# Patient Record
Sex: Female | Born: 1950 | Hispanic: Yes | Smoking: Never smoker
Health system: Southern US, Community
[De-identification: ages and names within clinical notes are randomized; demographics above are authoritative.]

## PROBLEM LIST (undated history)

## (undated) DIAGNOSIS — I10 Essential (primary) hypertension: Secondary | ICD-10-CM

---

## 2016-12-31 DIAGNOSIS — Z136 Encounter for screening for cardiovascular disorders: Secondary | ICD-10-CM | POA: Diagnosis not present

## 2016-12-31 DIAGNOSIS — H538 Other visual disturbances: Secondary | ICD-10-CM | POA: Diagnosis not present

## 2016-12-31 DIAGNOSIS — Z1383 Encounter for screening for respiratory disorder NEC: Secondary | ICD-10-CM | POA: Diagnosis not present

## 2016-12-31 DIAGNOSIS — Z5181 Encounter for therapeutic drug level monitoring: Secondary | ICD-10-CM | POA: Diagnosis not present

## 2016-12-31 DIAGNOSIS — Z131 Encounter for screening for diabetes mellitus: Secondary | ICD-10-CM | POA: Diagnosis not present

## 2016-12-31 DIAGNOSIS — Z950 Presence of cardiac pacemaker: Secondary | ICD-10-CM | POA: Diagnosis not present

## 2016-12-31 DIAGNOSIS — Z113 Encounter for screening for infections with a predominantly sexual mode of transmission: Secondary | ICD-10-CM | POA: Diagnosis not present

## 2016-12-31 DIAGNOSIS — Z01118 Encounter for examination of ears and hearing with other abnormal findings: Secondary | ICD-10-CM | POA: Diagnosis not present

## 2016-12-31 DIAGNOSIS — I10 Essential (primary) hypertension: Secondary | ICD-10-CM | POA: Diagnosis not present

## 2016-12-31 DIAGNOSIS — R251 Tremor, unspecified: Secondary | ICD-10-CM | POA: Diagnosis not present

## 2016-12-31 DIAGNOSIS — Z0000 Encounter for general adult medical examination without abnormal findings: Secondary | ICD-10-CM | POA: Diagnosis not present

## 2017-01-02 ENCOUNTER — Inpatient Hospital Stay (HOSPITAL_COMMUNITY)
Admission: EM | Admit: 2017-01-02 | Discharge: 2017-01-27 | DRG: 020 | Disposition: E | Payer: Medicare HMO | Attending: Pulmonary Disease | Admitting: Pulmonary Disease

## 2017-01-02 ENCOUNTER — Emergency Department (HOSPITAL_COMMUNITY): Payer: Medicare HMO

## 2017-01-02 ENCOUNTER — Encounter (HOSPITAL_COMMUNITY): Payer: Self-pay

## 2017-01-02 DIAGNOSIS — K567 Ileus, unspecified: Secondary | ICD-10-CM | POA: Diagnosis not present

## 2017-01-02 DIAGNOSIS — J9601 Acute respiratory failure with hypoxia: Secondary | ICD-10-CM | POA: Diagnosis not present

## 2017-01-02 DIAGNOSIS — I517 Cardiomegaly: Secondary | ICD-10-CM | POA: Diagnosis not present

## 2017-01-02 DIAGNOSIS — R0902 Hypoxemia: Secondary | ICD-10-CM | POA: Diagnosis not present

## 2017-01-02 DIAGNOSIS — E878 Other disorders of electrolyte and fluid balance, not elsewhere classified: Secondary | ICD-10-CM | POA: Diagnosis not present

## 2017-01-02 DIAGNOSIS — R40243 Glasgow coma scale score 3-8, unspecified time: Secondary | ICD-10-CM | POA: Diagnosis present

## 2017-01-02 DIAGNOSIS — I10 Essential (primary) hypertension: Secondary | ICD-10-CM | POA: Diagnosis present

## 2017-01-02 DIAGNOSIS — E876 Hypokalemia: Secondary | ICD-10-CM | POA: Diagnosis present

## 2017-01-02 DIAGNOSIS — E872 Acidosis: Secondary | ICD-10-CM | POA: Diagnosis present

## 2017-01-02 DIAGNOSIS — Y95 Nosocomial condition: Secondary | ICD-10-CM | POA: Diagnosis not present

## 2017-01-02 DIAGNOSIS — I609 Nontraumatic subarachnoid hemorrhage, unspecified: Secondary | ICD-10-CM

## 2017-01-02 DIAGNOSIS — R402432 Glasgow coma scale score 3-8, at arrival to emergency department: Secondary | ICD-10-CM | POA: Diagnosis not present

## 2017-01-02 DIAGNOSIS — Z515 Encounter for palliative care: Secondary | ICD-10-CM | POA: Diagnosis not present

## 2017-01-02 DIAGNOSIS — I602 Nontraumatic subarachnoid hemorrhage from anterior communicating artery: Principal | ICD-10-CM | POA: Diagnosis present

## 2017-01-02 DIAGNOSIS — E870 Hyperosmolality and hypernatremia: Secondary | ICD-10-CM | POA: Diagnosis not present

## 2017-01-02 DIAGNOSIS — I608 Other nontraumatic subarachnoid hemorrhage: Secondary | ICD-10-CM | POA: Diagnosis present

## 2017-01-02 DIAGNOSIS — I161 Hypertensive emergency: Secondary | ICD-10-CM | POA: Diagnosis present

## 2017-01-02 DIAGNOSIS — D72820 Lymphocytosis (symptomatic): Secondary | ICD-10-CM | POA: Diagnosis not present

## 2017-01-02 DIAGNOSIS — I729 Aneurysm of unspecified site: Secondary | ICD-10-CM

## 2017-01-02 DIAGNOSIS — Z23 Encounter for immunization: Secondary | ICD-10-CM | POA: Diagnosis not present

## 2017-01-02 DIAGNOSIS — R140 Abdominal distension (gaseous): Secondary | ICD-10-CM

## 2017-01-02 DIAGNOSIS — I6200 Nontraumatic subdural hemorrhage, unspecified: Secondary | ICD-10-CM | POA: Diagnosis not present

## 2017-01-02 DIAGNOSIS — R402431 Glasgow coma scale score 3-8, in the field [EMT or ambulance]: Secondary | ICD-10-CM | POA: Diagnosis not present

## 2017-01-02 DIAGNOSIS — Z7189 Other specified counseling: Secondary | ICD-10-CM | POA: Diagnosis not present

## 2017-01-02 DIAGNOSIS — Z01818 Encounter for other preprocedural examination: Secondary | ICD-10-CM

## 2017-01-02 DIAGNOSIS — J9690 Respiratory failure, unspecified, unspecified whether with hypoxia or hypercapnia: Secondary | ICD-10-CM

## 2017-01-02 DIAGNOSIS — J9600 Acute respiratory failure, unspecified whether with hypoxia or hypercapnia: Secondary | ICD-10-CM | POA: Diagnosis not present

## 2017-01-02 DIAGNOSIS — Z6836 Body mass index (BMI) 36.0-36.9, adult: Secondary | ICD-10-CM

## 2017-01-02 DIAGNOSIS — G9340 Encephalopathy, unspecified: Secondary | ICD-10-CM | POA: Diagnosis present

## 2017-01-02 DIAGNOSIS — Z66 Do not resuscitate: Secondary | ICD-10-CM | POA: Diagnosis not present

## 2017-01-02 DIAGNOSIS — Z950 Presence of cardiac pacemaker: Secondary | ICD-10-CM

## 2017-01-02 DIAGNOSIS — E663 Overweight: Secondary | ICD-10-CM | POA: Diagnosis present

## 2017-01-02 DIAGNOSIS — S066X0A Traumatic subarachnoid hemorrhage without loss of consciousness, initial encounter: Secondary | ICD-10-CM | POA: Diagnosis not present

## 2017-01-02 DIAGNOSIS — R739 Hyperglycemia, unspecified: Secondary | ICD-10-CM | POA: Diagnosis present

## 2017-01-02 DIAGNOSIS — I671 Cerebral aneurysm, nonruptured: Secondary | ICD-10-CM | POA: Diagnosis not present

## 2017-01-02 DIAGNOSIS — Z8249 Family history of ischemic heart disease and other diseases of the circulatory system: Secondary | ICD-10-CM

## 2017-01-02 DIAGNOSIS — E8770 Fluid overload, unspecified: Secondary | ICD-10-CM | POA: Diagnosis not present

## 2017-01-02 DIAGNOSIS — R569 Unspecified convulsions: Secondary | ICD-10-CM | POA: Diagnosis not present

## 2017-01-02 DIAGNOSIS — J690 Pneumonitis due to inhalation of food and vomit: Secondary | ICD-10-CM | POA: Diagnosis not present

## 2017-01-02 DIAGNOSIS — J189 Pneumonia, unspecified organism: Secondary | ICD-10-CM | POA: Diagnosis not present

## 2017-01-02 DIAGNOSIS — D649 Anemia, unspecified: Secondary | ICD-10-CM | POA: Diagnosis present

## 2017-01-02 DIAGNOSIS — R9401 Abnormal electroencephalogram [EEG]: Secondary | ICD-10-CM | POA: Diagnosis present

## 2017-01-02 DIAGNOSIS — Z9289 Personal history of other medical treatment: Secondary | ICD-10-CM

## 2017-01-02 DIAGNOSIS — R293 Abnormal posture: Secondary | ICD-10-CM | POA: Diagnosis not present

## 2017-01-02 DIAGNOSIS — J9811 Atelectasis: Secondary | ICD-10-CM | POA: Diagnosis not present

## 2017-01-02 HISTORY — DX: Essential (primary) hypertension: I10

## 2017-01-02 LAB — DIFFERENTIAL
BAND NEUTROPHILS: 0 %
BASOS ABS: 0 10*3/uL (ref 0.0–0.1)
BLASTS: 0 %
Basophils Relative: 0 %
EOS PCT: 5 %
Eosinophils Absolute: 0.6 10*3/uL (ref 0.0–0.7)
LYMPHS ABS: 6.1 10*3/uL — AB (ref 0.7–4.0)
Lymphocytes Relative: 47 %
METAMYELOCYTES PCT: 0 %
MONOS PCT: 7 %
MYELOCYTES: 0 %
Monocytes Absolute: 0.9 10*3/uL (ref 0.1–1.0)
NEUTROS ABS: 5.3 10*3/uL (ref 1.7–7.7)
Neutrophils Relative %: 41 %
OTHER: 0 %
Promyelocytes Absolute: 0 %
nRBC: 0 /100 WBC

## 2017-01-02 LAB — COMPREHENSIVE METABOLIC PANEL
ALBUMIN: 3.7 g/dL (ref 3.5–5.0)
ALK PHOS: 107 U/L (ref 38–126)
ALT: 64 U/L — ABNORMAL HIGH (ref 14–54)
ANION GAP: 7 (ref 5–15)
AST: 67 U/L — ABNORMAL HIGH (ref 15–41)
BILIRUBIN TOTAL: 0.8 mg/dL (ref 0.3–1.2)
BUN: 20 mg/dL (ref 6–20)
CO2: 22 mmol/L (ref 22–32)
Calcium: 8.1 mg/dL — ABNORMAL LOW (ref 8.9–10.3)
Chloride: 110 mmol/L (ref 101–111)
Creatinine, Ser: 0.83 mg/dL (ref 0.44–1.00)
GFR calc non Af Amer: >60 mL/min (ref >60)
GLUCOSE: 165 mg/dL — AB (ref 65–99)
POTASSIUM: 3.3 mmol/L — AB (ref 3.5–5.1)
Sodium: 139 mmol/L (ref 135–145)
TOTAL PROTEIN: 5.7 g/dL — AB (ref 6.5–8.1)

## 2017-01-02 LAB — I-STAT ARTERIAL BLOOD GAS, ED
ACID-BASE EXCESS: 2 mmol/L (ref 0.0–2.0)
BICARBONATE: 25.4 mmol/L (ref 20.0–28.0)
O2 Saturation: 100 %
TCO2: 26 mmol/L (ref 0–100)
pCO2 arterial: 34.7 mmHg (ref 32.0–48.0)
pH, Arterial: 7.471 — ABNORMAL HIGH (ref 7.350–7.450)
pO2, Arterial: 357 mmHg — ABNORMAL HIGH (ref 83.0–108.0)

## 2017-01-02 LAB — I-STAT CHEM 8, ED
BUN: 23 mg/dL — AB (ref 6–20)
CREATININE: 0.8 mg/dL (ref 0.44–1.00)
Calcium, Ion: 1.06 mmol/L — ABNORMAL LOW (ref 1.15–1.40)
Chloride: 108 mmol/L (ref 101–111)
GLUCOSE: 162 mg/dL — AB (ref 65–99)
HCT: 33 % — ABNORMAL LOW (ref 36.0–46.0)
HEMOGLOBIN: 11.2 g/dL — AB (ref 12.0–15.0)
Potassium: 3.4 mmol/L — ABNORMAL LOW (ref 3.5–5.1)
Sodium: 143 mmol/L (ref 135–145)
TCO2: 23 mmol/L (ref 0–100)

## 2017-01-02 LAB — URINALYSIS, ROUTINE W REFLEX MICROSCOPIC
BACTERIA UA: NONE SEEN
BILIRUBIN URINE: NEGATIVE
Glucose, UA: NEGATIVE mg/dL
Hgb urine dipstick: NEGATIVE
KETONES UR: NEGATIVE mg/dL
LEUKOCYTES UA: NEGATIVE
NITRITE: NEGATIVE
PH: 6 (ref 5.0–8.0)
PROTEIN: 100 mg/dL — AB
Specific Gravity, Urine: 1.017 (ref 1.005–1.030)

## 2017-01-02 LAB — I-STAT TROPONIN, ED: TROPONIN I, POC: 0 ng/mL (ref 0.00–0.08)

## 2017-01-02 LAB — RAPID URINE DRUG SCREEN, HOSP PERFORMED
Amphetamines: NOT DETECTED
Barbiturates: NOT DETECTED
Benzodiazepines: NOT DETECTED
Cocaine: NOT DETECTED
OPIATES: NOT DETECTED
TETRAHYDROCANNABINOL: NOT DETECTED

## 2017-01-02 LAB — APTT: aPTT: 34 seconds (ref 24–36)

## 2017-01-02 LAB — CBC
HEMATOCRIT: 36 % (ref 36.0–46.0)
HEMOGLOBIN: 11.9 g/dL — AB (ref 12.0–15.0)
MCH: 28.4 pg (ref 26.0–34.0)
MCHC: 33.1 g/dL (ref 30.0–36.0)
MCV: 85.9 fL (ref 78.0–100.0)
Platelets: 236 10*3/uL (ref 150–400)
RBC: 4.19 MIL/uL (ref 3.87–5.11)
RDW: 12.3 % (ref 11.5–15.5)
WBC: 12.9 10*3/uL — ABNORMAL HIGH (ref 4.0–10.5)

## 2017-01-02 LAB — PROTIME-INR
INR: 0.97
Prothrombin Time: 12.8 seconds (ref 11.4–15.2)

## 2017-01-02 LAB — TRIGLYCERIDES: Triglycerides: 279 mg/dL — ABNORMAL HIGH (ref <150)

## 2017-01-02 LAB — ETHANOL: Alcohol, Ethyl (B): <5 mg/dL (ref <5)

## 2017-01-02 MED ORDER — PROPOFOL 1000 MG/100ML IV EMUL
INTRAVENOUS | Status: AC
  Administered 2017-01-02: 5 ug/kg/min
  Filled 2017-01-02: qty 100

## 2017-01-02 MED ORDER — NALOXONE HCL 2 MG/2ML IJ SOSY
PREFILLED_SYRINGE | INTRAMUSCULAR | Status: AC
  Filled 2017-01-02: qty 2

## 2017-01-02 MED ORDER — HYDRALAZINE HCL 20 MG/ML IJ SOLN
20.0000 mg | Freq: Once | INTRAMUSCULAR | Status: AC
  Administered 2017-01-02: 10 mg via INTRAVENOUS
  Filled 2017-01-02: qty 1

## 2017-01-02 MED ORDER — SODIUM CHLORIDE 0.9 % IV SOLN
1000.0000 mg | Freq: Once | INTRAVENOUS | Status: AC
  Administered 2017-01-02: 1000 mg via INTRAVENOUS
  Filled 2017-01-02: qty 10

## 2017-01-02 MED ORDER — NALOXONE HCL 2 MG/2ML IJ SOSY
PREFILLED_SYRINGE | INTRAMUSCULAR | Status: AC | PRN
  Administered 2017-01-02: 2 mg via INTRAVENOUS

## 2017-01-02 MED ORDER — CHLORHEXIDINE GLUCONATE 0.12% ORAL RINSE (MEDLINE KIT)
15.0000 mL | Freq: Two times a day (BID) | OROMUCOSAL | Status: DC
  Administered 2017-01-02 – 2017-01-13 (×22): 15 mL via OROMUCOSAL

## 2017-01-02 MED ORDER — SUCCINYLCHOLINE CHLORIDE 20 MG/ML IJ SOLN
INTRAMUSCULAR | Status: DC | PRN
  Administered 2017-01-02: 100 mg via INTRAVENOUS

## 2017-01-02 MED ORDER — SODIUM CHLORIDE 0.9 % IV SOLN
1000.0000 mg | Freq: Two times a day (BID) | INTRAVENOUS | Status: DC
  Administered 2017-01-03 – 2017-01-13 (×21): 1000 mg via INTRAVENOUS
  Filled 2017-01-02 (×22): qty 10

## 2017-01-02 MED ORDER — LABETALOL HCL 5 MG/ML IV SOLN: 20.0000 mg | Freq: Once | INTRAVENOUS | Status: DC

## 2017-01-02 MED ORDER — POTASSIUM CHLORIDE 10 MEQ/100ML IV SOLN
10.0000 meq | INTRAVENOUS | Status: AC
  Administered 2017-01-02 – 2017-01-03 (×2): 10 meq via INTRAVENOUS
  Filled 2017-01-02 (×2): qty 100

## 2017-01-02 MED ORDER — ORAL CARE MOUTH RINSE
15.0000 mL | OROMUCOSAL | Status: DC
  Administered 2017-01-03 – 2017-01-13 (×103): 15 mL via OROMUCOSAL

## 2017-01-02 MED ORDER — ONDANSETRON HCL 4 MG/2ML IJ SOLN
4.0000 mg | Freq: Four times a day (QID) | INTRAMUSCULAR | Status: DC | PRN
  Filled 2017-01-02: qty 2

## 2017-01-02 MED ORDER — ETOMIDATE 2 MG/ML IV SOLN
INTRAVENOUS | Status: DC | PRN
  Administered 2017-01-02: 20 mg via INTRAVENOUS

## 2017-01-02 MED ORDER — FENTANYL CITRATE (PF) 100 MCG/2ML IJ SOLN
50.0000 ug | INTRAMUSCULAR | Status: DC | PRN
  Administered 2017-01-05: 50 ug via INTRAVENOUS
  Filled 2017-01-02 (×2): qty 2

## 2017-01-02 MED ORDER — NICARDIPINE HCL IN NACL 20-0.86 MG/200ML-% IV SOLN
3.0000 mg/h | INTRAVENOUS | Status: DC
  Administered 2017-01-02 (×2): 7.5 mg/h via INTRAVENOUS
  Administered 2017-01-03: 3 mg/h via INTRAVENOUS
  Administered 2017-01-03: 5 mg/h via INTRAVENOUS
  Administered 2017-01-03: 2.5 mg/h via INTRAVENOUS
  Administered 2017-01-03: 3.5 mg/h via INTRAVENOUS
  Administered 2017-01-04: 5 mg/h via INTRAVENOUS
  Administered 2017-01-04: 7.5 mg/h via INTRAVENOUS
  Administered 2017-01-04: 8 mg/h via INTRAVENOUS
  Administered 2017-01-04: 5 mg/h via INTRAVENOUS
  Administered 2017-01-04: 7 mg/h via INTRAVENOUS
  Administered 2017-01-04 – 2017-01-05 (×3): 5 mg/h via INTRAVENOUS
  Administered 2017-01-05: 7 mg/h via INTRAVENOUS
  Administered 2017-01-05: 5 mg/h via INTRAVENOUS
  Administered 2017-01-05: 7 mg/h via INTRAVENOUS
  Administered 2017-01-05: 8 mg/h via INTRAVENOUS
  Administered 2017-01-06: 5 mg/h via INTRAVENOUS
  Administered 2017-01-06: 7 mg/h via INTRAVENOUS
  Administered 2017-01-06: 3 mg/h via INTRAVENOUS
  Administered 2017-01-06 (×2): 11 mg/h via INTRAVENOUS
  Administered 2017-01-06: 4 mg/h via INTRAVENOUS
  Administered 2017-01-06: 10 mg/h via INTRAVENOUS
  Administered 2017-01-07 (×2): 15 mg/h via INTRAVENOUS
  Administered 2017-01-07: 7.5 mg/h via INTRAVENOUS
  Administered 2017-01-07: 10 mg/h via INTRAVENOUS
  Administered 2017-01-07: 15 mg/h via INTRAVENOUS
  Administered 2017-01-07 (×2): 12 mg/h via INTRAVENOUS
  Administered 2017-01-07: 12.5 mg/h via INTRAVENOUS
  Administered 2017-01-07: 8 mg/h via INTRAVENOUS
  Filled 2017-01-02 (×33): qty 200

## 2017-01-02 MED ORDER — PROPOFOL 1000 MG/100ML IV EMUL
0.0000 ug/kg/min | INTRAVENOUS | Status: DC
  Administered 2017-01-02: 10 ug/kg/min via INTRAVENOUS
  Administered 2017-01-03 – 2017-01-04 (×3): 20 ug/kg/min via INTRAVENOUS
  Administered 2017-01-04: 5 ug/kg/min via INTRAVENOUS
  Administered 2017-01-05: 30 ug/kg/min via INTRAVENOUS
  Administered 2017-01-05 (×2): 20 ug/kg/min via INTRAVENOUS
  Administered 2017-01-06 (×2): 40 ug/kg/min via INTRAVENOUS
  Administered 2017-01-06 (×2): 37.786 ug/kg/min via INTRAVENOUS
  Administered 2017-01-07: 50 ug/kg/min via INTRAVENOUS
  Administered 2017-01-07: 30 ug/kg/min via INTRAVENOUS
  Filled 2017-01-02 (×13): qty 100

## 2017-01-02 MED ORDER — FENTANYL CITRATE (PF) 100 MCG/2ML IJ SOLN
50.0000 ug | INTRAMUSCULAR | Status: DC | PRN
  Administered 2017-01-04 – 2017-01-06 (×10): 50 ug via INTRAVENOUS
  Filled 2017-01-02 (×9): qty 2

## 2017-01-02 MED ORDER — NICARDIPINE HCL IN NACL 20-0.86 MG/200ML-% IV SOLN
3.0000 mg/h | Freq: Once | INTRAVENOUS | Status: AC
  Administered 2017-01-02: 5 mg/h via INTRAVENOUS
  Filled 2017-01-02: qty 200

## 2017-01-02 MED ORDER — PANTOPRAZOLE SODIUM 40 MG IV SOLR
40.0000 mg | Freq: Every day | INTRAVENOUS | Status: DC
  Administered 2017-01-02 – 2017-01-12 (×11): 40 mg via INTRAVENOUS
  Filled 2017-01-02 (×11): qty 40

## 2017-01-02 MED ORDER — SODIUM CHLORIDE 0.9 % IV SOLN
1.0000 g | Freq: Once | INTRAVENOUS | Status: AC
  Administered 2017-01-02: 1 g via INTRAVENOUS
  Filled 2017-01-02: qty 10

## 2017-01-02 NOTE — H&P (Signed)
PULMONARY / CRITICAL CARE MEDICINE   Name: Sherri Hale MRN: [id number] DOB: 1951-03-29    ADMISSION DATE:  01/22/2017 CONSULTATION DATE:  6/6  REFERRING MD:  Dr. Verdie Mosher EDP  CHIEF COMPLAINT:  SAH  HISTORY OF PRESENT ILLNESS: Patient is encephalopathic and/or intubated. Therefore history has been obtained from chart review.   66 year old female with past medical history of hypertension who presented to [facility] emergency department 6/6 of the EMS after collapsing at [facility]. While at the [facility] she was complaining of severe headache and then suddenly became unresponsive. She didn't mature to seizure activity and subsequently vomited. She did briefly have some return of consciousness, however, this was short-lived and she was unresponsive on arrival to the emergency department. She was intubated for airway protection. She was profoundly hypertensive with systolic blood pressures in the 230s. In the emergency department she was started on nicardipine infusion and loaded with IV Keppra. She was taken for a CT scan of the head which showed extensive subarachnoid hemorrhage. She was evaluated by neurosurgery in the emergency department who will reevaluate her once more stable. Likely in the morning. PCCM has been asked to admit to ICU.   PAST MEDICAL HISTORY :  She  has a past medical history of Hypertension.  PAST SURGICAL HISTORY: She  has no past surgical history on file.  No Known Allergies  No current facility-administered medications on file prior to encounter.    No current outpatient prescriptions on file prior to encounter.    FAMILY HISTORY:  Her has no family status information on file.    SOCIAL HISTORY: She  reports that she has never smoked. She has never used smokeless tobacco. She reports that she does not drink alcohol or use drugs.  REVIEW OF SYSTEMS:   Unable as patient is intubated and encephalopathic  SUBJECTIVE:    VITAL SIGNS: BP (!) 113/54    Pulse 88   Temp 97 F (36.1 C)   Resp (!) 25   Ht 5\' 5"  (1.651 m)   Wt 81.6 kg (180 lb)   LMP  (LMP Unknown)   SpO2 97%   BMI 29.95 kg/m   HEMODYNAMICS:    VENTILATOR SETTINGS: Vent Mode: PRVC FiO2 (%):  [40 %] 40 % Set Rate:  [20 bmp] 20 bmp Vt Set:  [490 mL] 490 mL PEEP:  [5 cmH20] 5 cmH20 Plateau Pressure:  [17 cmH20] 17 cmH20  INTAKE / OUTPUT: I/O last 3 completed shifts: In: 110 [IV Piggyback:110] Out: -   PHYSICAL EXAMINATION: General:  Overweight female in NAD Neuro:  Comatose with decerebrate posturing of extremities to pain HEENT:  [place]/AT, pupils 2mm and sluggish. No JVD Cardiovascular:  RRR, no MRG Lungs:  Clear Abdomen:  Soft, non-distended Musculoskeletal:  No acute deformity Skin:  Grossly intact  LABS:  BMET  Recent Labs Lab 01/04/2017 1810 01/01/2017 1824  NA 139 143  K 3.3* 3.4*  CL 110 108  CO2 22  --   BUN 20 23*  CREATININE 0.83 0.80  GLUCOSE 165* 162*    Electrolytes  Recent Labs Lab 01/04/2017 1810  CALCIUM 8.1*    CBC  Recent Labs Lab 01/01/2017 1810 12/28/2016 1824  WBC 12.9*  --   HGB 11.9* 11.2*  HCT 36.0 33.0*  PLT 236  --     Coag's  Recent Labs Lab 01/25/2017 1810  APTT 34  INR 0.97    Sepsis Markers No results for input(s): LATICACIDVEN, PROCALCITON, O2SATVEN in the last  168 hours.  ABG  Recent Labs Lab 01/01/2017 1931  PHART 7.471*  PCO2ART 34.7  PO2ART 357.0*    Liver Enzymes  Recent Labs Lab 01/23/2017 1810  AST 67*  ALT 64*  ALKPHOS 107  BILITOT 0.8  ALBUMIN 3.7    Cardiac Enzymes No results for input(s): TROPONINI, PROBNP in the last 168 hours.  Glucose No results for input(s): GLUCAP in the last 168 hours.  Imaging Ct Head Wo Contrast  Result Date: 01/15/2017 CLINICAL DATA:  Fall with vomiting. EXAM: CT HEAD WITHOUT CONTRAST TECHNIQUE: Contiguous axial images were obtained from the base of the skull through the vertex without intravenous contrast. COMPARISON:  None. FINDINGS: Brain:  There is diffuse subarachnoid hemorrhage over both convexities and extending into the sylvian fissures and basal cisterns. The largest concentration of blood is along the anterior interhemispheric fissure. It is unclear if there is some parenchymal component of this location as well. There is mass effect on the right lateral ventricle but no midline shift. Basal cisterns are effaced and there is crowding of the foramen magnum with early/impending herniation of cerebellar tonsils. There is intraventricular extension of blood within the lateral ventricles near the foramen of Monro and in the third ventricle. Vascular: No hyperdense vessel or unexpected calcification. Skull: Normal visualized skull base, calvarium and extracranial soft tissues. Sinuses/Orbits: No sinus fluid levels or advanced mucosal thickening. No mastoid effusion. Normal orbits. IMPRESSION: 1. Massive subarachnoid hemorrhage over both convexities and within the sylvian fissures, basal cisterns and the lateral and third ventricles. Given the distribution, an aneurysmal etiology is favored, though other entities may also cause this distribution. However, it is somewhat unclear whether there may be a component of intraparenchymal hemorrhage of the inferomedial frontal lobe. CTA of the head is recommended. 2. Effacement of the basal cisterns and crowding of the foramen magnum with early/impending herniation of the cerebellar tonsils. Critical Value/emergent results were called by telephone at the time of interpretation on 01/06/2017 at 6:58 pm to Dr. Crista Curb , who verbally acknowledged these results. Electronically Signed   By: Deatra Robinson M.D.   On: 01/20/2017 19:07   Dg Chest Portable 1 View  Result Date: 01/12/2017 CLINICAL DATA:  Ventilator dependent respiratory failure. Intubation. EXAM: PORTABLE CHEST 1 VIEW COMPARISON:  None. FINDINGS: Endotracheal tube tip in satisfactory position projecting approximately 3 cm above the carina. Nasogastric  tube courses below the diaphragm into the stomach. Cardiac silhouette mildly enlarged for AP portable technique. Perihilar airspace opacities are present. Lungs otherwise clear. No visible pleural effusions. No pneumothorax. Left subclavian dual lead transvenous pacemaker appears intact. IMPRESSION: 1. Support apparatus satisfactory. 2. Perihilar airspace opacities likely indicating mild pulmonary edema. Mild cardiomegaly. Electronically Signed   By: Hulan Saas M.D.   On: 01/23/2017 18:30     STUDIES:  6/6 CT head > Massive subarachnoid hemorrhage over both convexities and within the sylvian fissures, basal cisterns and the lateral and third ventricles. Given the distribution, an aneurysmal etiology is favored, though other entities may also cause this distribution. However, it is somewhat unclear whether there may be a component of intraparenchymal hemorrhage of the inferomedial frontal lobe. CTA of the head is recommended. Effacement of the basal cisterns and crowding of the foramen magnum with early/impending herniation of the cerebellar tonsils.  CULTURES:   ANTIBIOTICS:   SIGNIFICANT EVENTS: 6/6 admit  LINES/TUBES: ett 6/6 >  DISCUSSION:   ASSESSMENT / PLAN:  PULMONARY A: Airway compromise secondary to [facility]  P:   Full vent  support ABG noted CXR in AM VAP bundle  CARDIOVASCULAR A:  Hypertensive emergency  P:  Telemetry monitoring Nicardipine infusion for SBP 140-115mmHg Holding home losartan, HCTZ  RENAL A:   Hypokalemia Hypocalcemia  P:  Follow BMP Gentle hydration Supp Ca, K I&O strict  GASTROINTESTINAL A:   No acute issues  P:   NPO OGT to LIS Zofran for vomiting  HEMATOLOGIC A:   Anemia  P:  Follow CBC SCDs only  INFECTIOUS A:   Possible aspiration  P:   Monitor off ABX  ENDOCRINE A:   Hyperglycemia  P:   SSI  NEUROLOGIC A:   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  I agree with the resident/APP's  history, physical examination, assessment, and plans.    I have edited the above note and modified it according to our agreed history, physical examination, assessment and plan.   Briefly, nonsmoker, non-alcoholic drinker, not known to have lung disease, brought into the emergency room because of unresponsiveness. Chart review was done as well as interviewing patient's brother whom she has been staying with the last 1-2 weeks. Patient is originally from [place], has been in the [place] for 15 years. She is single, has 2 children but they are estranged. Daughter lives in [place] and son lives in [place]. As far as her brother knows, her only medical issue is hypertension for which she sees her primary care doctor. She moved out of her daughter's house 1-2 weeks ago and has been staying with her brother. Today, she complained of generalized headache prompting her to go to [facility] and have her eyes checked.  While at the [facility] she was complaining of severe headache and then suddenly became unresponsive. There was note of seizure activity. She was brought to the ED where she was intubated for airway protection. She was profoundly hypertensive with systolic blood pressures in the 230s. In the emergency department she was started on nicardipine infusion and loaded with IV Keppra. She was taken for a CT scan of the head which showed extensive subarachnoid hemorrhage. She was evaluated by neurosurgery in the emergency department who will reevaluate her once  more stable.PCCM has been asked to admit to ICU.  She started having nonpurposeful movements. Also started having decortication. She was started on propofol drip. She was also placed on the Cardizem drip. Both drips have helped  with her blood pressure control.  Vitals:  Vitals:   01/14/2017 2120 01/08/2017 2125 01/18/2017 2131 01/10/2017 2158  BP: 122/79 (!) 141/67 (!) 148/57   Pulse: 86 89 86   Resp: (!) 26 15 (!) 24   Temp: 97.3 F (36.3 C) 97.3 F (36.3 C) 97.3 F (36.3 C)   TempSrc:      SpO2: 98% 97% 100% 100%  Weight:      Height:        Constitutional/General: well-nourished, well-developed, intubated, sedated, not in any distress  Body mass index is 29.95 kg/m. Wt Readings from Last 3 Encounters:  01/01/2017 81.6 kg (180 lb)  HEENT: PERLA, anicteric sclerae. (-) Oral thrush. Intubated, ETT in place  Neck: No masses. Midline trachea. No JVD, (-) LAD. (-) bruits appreciated.  Respiratory/Chest: Grossly normal chest. (-) deformity. (-) Accessory muscle use.  Symmetric expansion. Diminished BS on both lower lung zones. (-) wheezing, crackles, rhonchi (-) egophony  Cardiovascular: Regular rate and  rhythm, heart sounds normal, no murmur or gallops,  Trace peripheral edema  Gastrointestinal:  Normal bowel sounds. Soft, non-tender. No hepatosplenomegaly.  (-) masses.   Musculoskeletal:  Normal muscle tone.   Extremities: Grossly normal. (-) clubbing, cyanosis.  (-) edema  Skin: (-) rash,lesions seen.   Neurological/Psychiatric : sedated, intubated. Pupils were 3 mm, equal and sluggish. Corneals were elicited. No facial asymmetry. No gag elicited. Supple neck. Occasional decortication according to the nurse.     CBC Recent Labs     12/29/2016  1810  01/01/2017  1824  WBC  12.9*   --   HGB  11.9*  11.2*  HCT  36.0  33.0*  PLT  236   --     Coag's Recent Labs     01/15/2017  1810  APTT  34  INR  0.97    BMET Recent Labs     01/05/2017  1810  01/12/2017  1824   NA  139  143  K  3.3*  3.4*  CL  110  108  CO2  22   --   BUN  20  23*  CREATININE  0.83  0.80  GLUCOSE  165*  162*    Electrolytes Recent Labs     12/30/2016  1810  CALCIUM  8.1*    Sepsis Markers No results for input(s): PROCALCITON, O2SATVEN in the last 72 hours.  Invalid input(s): LACTICACIDVEN  ABG Recent Labs     01/08/2017  1931  PHART  7.471*  PCO2ART  34.7  PO2ART  357.0*    Liver Enzymes Recent Labs     01/12/2017  1810  AST  67*  ALT  64*  ALKPHOS  107  BILITOT  0.8  ALBUMIN  3.7    Cardiac Enzymes No results for input(s): TROPONINI, PROBNP in the last 72 hours.  Glucose No results for input(s): GLUCAP in the last 72 hours.  Imaging Ct Head Wo Contrast  Result Date: 01/23/2017 CLINICAL DATA:  Fall with vomiting. EXAM: CT HEAD WITHOUT CONTRAST TECHNIQUE: Contiguous axial images were obtained from the base of the skull through the vertex without intravenous contrast. COMPARISON:  None. FINDINGS: Brain: There is diffuse subarachnoid hemorrhage over both convexities and extending into the sylvian fissures and basal cisterns. The largest concentration of blood is along the anterior interhemispheric fissure. It is unclear if there is some parenchymal component of this location as well. There is mass effect on the right lateral ventricle but no midline shift. Basal cisterns are effaced and there is crowding of the foramen magnum with early/impending herniation of cerebellar tonsils. There is intraventricular extension of blood within the lateral ventricles near the foramen of Monro and in the third ventricle. Vascular: No hyperdense vessel or unexpected calcification. Skull: Normal visualized skull base, calvarium and extracranial soft tissues. Sinuses/Orbits: No sinus fluid levels or advanced mucosal thickening. No mastoid effusion. Normal orbits. IMPRESSION: 1. Massive subarachnoid hemorrhage over both convexities and within the sylvian fissures, basal cisterns and  the lateral and third ventricles. Given the distribution, an aneurysmal etiology is favored, though other entities may also cause this distribution. However, it is somewhat unclear whether there may  be a component of intraparenchymal hemorrhage of the inferomedial frontal lobe. CTA of the head is recommended. 2. Effacement of the basal cisterns and crowding of the foramen magnum with early/impending herniation of the cerebellar tonsils. Critical Value/emergent results were called by telephone at the time of interpretation on 01/12/2017 at 6:58 pm to Dr. Crista CurbANA LIU , who verbally acknowledged these results. Electronically Signed   By: Deatra RobinsonKevin  Herman M.D.   On: 09-13-2016 19:07   Dg Chest Portable 1 View  Result Date: 12/30/2016 CLINICAL DATA:  Ventilator dependent respiratory failure. Intubation. EXAM: PORTABLE CHEST 1 VIEW COMPARISON:  None. FINDINGS: Endotracheal tube tip in satisfactory position projecting approximately 3 cm above the carina. Nasogastric tube courses below the diaphragm into the stomach. Cardiac silhouette mildly enlarged for AP portable technique. Perihilar airspace opacities are present. Lungs otherwise clear. No visible pleural effusions. No pneumothorax. Left subclavian dual lead transvenous pacemaker appears intact. IMPRESSION: 1. Support apparatus satisfactory. 2. Perihilar airspace opacities likely indicating mild pulmonary edema. Mild cardiomegaly. Electronically Signed   By: Hulan Saashomas  Lawrence M.D.   On: 09-13-2016 18:30    Assessment / Plan Acute hypoxemic respiratory failure secondary to unable to protect airway secondary to massive subarachnoid hemorrhage. - Continue ventilatory support. ABG is  acceptable. - No weaning for now 2/2 massive SAH    Massive subarachnoid hemorrhage. Likely aneurysmal. Possible intraparenchymal bleed in the inferior medial  temporal lobes. Possible herniation of cerebellar tonsils. - Per neurosurgery. Neurosurgeon has seen and evaluated the patient.  According to the nurse, no plan for surgery tonight. Neurosurgery will evaluate in the morning. - Continue the Cardene drip to keep SBP 140-160 mm Hg per neurosurgery   HTN Emergency - Continue nicardipine drip. Blood pressure goal 140-1 60 mmHg. Propofol drip is certainly helping with blood pressure control as well.    I spent  30  minutes of Critical Care time with this patient today. This is my time spent independent of the APP or resident.   Family :Family updated at length today.  I discussed the case with the patient's brother and his wife. It appears he is the next of kin making decision for the patient. As mentioned, patient has a daughter who lives in [place] but she is estranged. She has a son who lives in [place]. I mentioned to the patient's brother to let the children aware that their mother is critically ill. Need to reassess GOC in 12-24 hours.    Pollie MeyerJ. Angelo A de Dios, MD 01/22/2017, 10:07 PM [facility] Pager [phone] After 3 pm or if no answer, call [phone]

## 2017-01-02 NOTE — ED Notes (Signed)
Pt breathing over vent, RT at bedside

## 2017-01-02 NOTE — Progress Notes (Signed)
Bedside RN reporting to eRN - intermittent shaking movement of left hand and distal forearm  Plan advsied erN to inform bedside RN to inform neurology  .Dr. Kalman ShanMurali Liam Bossman, M.D., [facility] Center[facility].C.C.P Pulmonary and Critical Care Medicine Staff Physician [facility] Pager: [phone], If no answer or between  15:00h - 7:00h: call [phone]  01/18/2017 11:45 PM

## 2017-01-02 NOTE — Progress Notes (Signed)
Patient intubated by ED physician due to respiratory arrest and LOC, good color change on ETCO2 detector capnography 41, SATS 99%, RR 24 BPM, HR 60 BPM, BP 222/198.

## 2017-01-02 NOTE — ED Provider Notes (Signed)
[facility]-EMERGENCY DEPT Provider Note   CSN: [id number] Arrival date & time: 01/06/2017  1748     History   Chief Complaint Chief Complaint  Patient presents with  . unresponsive    HPI Sherri Hale is a 66 y.o. female.  HPI Level 5 caveat due to unresponsiveness. Per EMS, patient at [facility] when she complained of headache and became unresponsive. Had reported witnessed seizure and vomiting subsequently. Briefly had return of consciousness and answers questions, but en route became unresponsive again. BP 300 by palpation by EMS.  Past Medical History:  Diagnosis Date  . Hypertension     Patient Active Problem List   Diagnosis Date Noted  . SAH (subarachnoid hemorrhage) (HCC) 01/19/2017  . 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 Acute hypoxemic respiratory failure (HCC)     History reviewed. No pertinent surgical history.  OB History    No data available       Home Medications    Prior to Admission medications   Medication Sig Start Date End Date Taking? Authorizing Provider  hydrochlorothiazide (HYDRODIURIL) 25 MG tablet Take 25 mg by mouth daily. 12/31/16   [provider]  losartan (COZAAR) 25 MG tablet Take 25 mg by mouth daily. 12/31/16   [provider]    Family History History reviewed. No pertinent family history.  Social History Social History  Substance Use Topics  . Smoking status: Never Smoker  . Smokeless tobacco: Never Used  . Alcohol use No     Allergies   Patient has no known allergies.   Review of Systems Review of Systems  Unable to perform ROS: Patient unresponsive     Physical Exam Updated Vital Signs BP 132/74   Pulse 83   Temp 97.3 F (36.3 C)   Resp (!) 28   Ht 5' 5" (1.651 m)   Wt 81.6 kg (180 lb)   LMP  (LMP Unknown)   SpO2 99%   BMI 29.95 kg/m   Physical Exam Physical Exam  Constitutional: sonorous respirations spontaneously, unresponsive to painful  stimuli or voice HENT:  Head: Normocephalic.  Neck: supple Eyes: Conjunctivae are normal. PERRL, 2mm symmetric Cardiovascular: Normal rate and intact distal pulses.   Pulmonary/Chest: BVM assisted breath sounds, bilateral breath sounds present Abdominal: She exhibits no distension. Soft Musculoskeletal: She exhibits no deformity.  Neurological: unresponsive to voice or touch, sonorous spontaneous respirations, extensor posturing noted, bilateral upgoing babinski Skin: Skin is warm and dry.  Nursing note and vitals reviewed.   ED Treatments / Results  Labs (all labs ordered are listed, but only abnormal results are displayed) Labs Reviewed  CBC - Abnormal; Notable for the following:       Result Value   WBC 12.9 (*)    Hemoglobin 11.9 (*)    All other components within normal limits  DIFFERENTIAL - Abnormal; Notable for the following:    Lymphs Abs 6.1 (*)    All other components within normal limits  COMPREHENSIVE METABOLIC PANEL - Abnormal; Notable for the following:    Potassium 3.3 (*)    Glucose, Bld 165 (*)    Calcium 8.1 (*)    Total Protein 5.7 (*)    AST 67 (*)    ALT 64 (*)    All other components within normal limits  URINALYSIS, ROUTINE W REFLEX MICROSCOPIC - Abnormal; Notable for the following:    Protein, ur 100 (*)    Squamous Epithelial / LPF 0-5 (*)      All other components within normal limits  TRIGLYCERIDES - Abnormal; Notable for the following:    Triglycerides 279 (*)    All other components within normal limits  I-STAT CHEM 8, ED - Abnormal; Notable for the following:    Potassium 3.4 (*)    BUN 23 (*)    Glucose, Bld 162 (*)    Calcium, Ion 1.06 (*)    Hemoglobin 11.2 (*)    HCT 33.0 (*)    All other components within normal limits  I-STAT ARTERIAL BLOOD GAS, ED - Abnormal; Notable for the following:    pH, Arterial 7.471 (*)    pO2, Arterial 357.0 (*)    All other components within normal limits  MRSA PCR SCREENING  ETHANOL  PROTIME-INR    APTT  RAPID URINE DRUG SCREEN, HOSP PERFORMED  CBC  BASIC METABOLIC PANEL  MAGNESIUM  PHOSPHORUS  BLOOD GAS, ARTERIAL  PATHOLOGIST SMEAR REVIEW  I-STAT TROPOININ, ED    EKG  EKG Interpretation None       Radiology Ct Head Wo Contrast  Result Date: 01/01/2017 CLINICAL DATA:  Fall with vomiting. EXAM: CT HEAD WITHOUT CONTRAST TECHNIQUE: Contiguous axial images were obtained from the base of the skull through the vertex without intravenous contrast. COMPARISON:  None. FINDINGS: Brain: There is diffuse subarachnoid hemorrhage over both convexities and extending into the sylvian fissures and basal cisterns. The largest concentration of blood is along the anterior interhemispheric fissure. It is unclear if there is some parenchymal component of this location as well. There is mass effect on the right lateral ventricle but no midline shift. Basal cisterns are effaced and there is crowding of the foramen magnum with early/impending herniation of cerebellar tonsils. There is intraventricular extension of blood within the lateral ventricles near the foramen of Monro and in the third ventricle. Vascular: No hyperdense vessel or unexpected calcification. Skull: Normal visualized skull base, calvarium and extracranial soft tissues. Sinuses/Orbits: No sinus fluid levels or advanced mucosal thickening. No mastoid effusion. Normal orbits. IMPRESSION: 1. Massive subarachnoid hemorrhage over both convexities and within the sylvian fissures, basal cisterns and the lateral and third ventricles. Given the distribution, an aneurysmal etiology is favored, though other entities may also cause this distribution. However, it is somewhat unclear whether there may be a component of intraparenchymal hemorrhage of the inferomedial frontal lobe. CTA of the head is recommended. 2. Effacement of the basal cisterns and crowding of the foramen magnum with early/impending herniation of the cerebellar tonsils. Critical  Value/emergent results were called by telephone at the time of interpretation on 01/09/2017 at 6:58 pm to Dr. Brantley Stage , who verbally acknowledged these results. Electronically Signed   By: Ulyses Jarred M.D.   On: 01/25/2017 19:07   Dg Chest Portable 1 View  Result Date: 01/09/2017 CLINICAL DATA:  Ventilator dependent respiratory failure. Intubation. EXAM: PORTABLE CHEST 1 VIEW COMPARISON:  None. FINDINGS: Endotracheal tube tip in satisfactory position projecting approximately 3 cm above the carina. Nasogastric tube courses below the diaphragm into the stomach. Cardiac silhouette mildly enlarged for AP portable technique. Perihilar airspace opacities are present. Lungs otherwise clear. No visible pleural effusions. No pneumothorax. Left subclavian dual lead transvenous pacemaker appears intact. IMPRESSION: 1. Support apparatus satisfactory. 2. Perihilar airspace opacities likely indicating mild pulmonary edema. Mild cardiomegaly. Electronically Signed   By: Evangeline Dakin M.D.   On: 01/08/2017 18:30    Procedures Procedures (including critical care time) INTUBATION Performed by: Forde Dandy  Required items: required blood products, implants, devices,  and special equipment available Patient identity confirmed: provided demographic data and hospital-assigned identification number Time out: Immediately prior to procedure a "time out" was called to verify the correct patient, procedure, equipment, support staff and site/side marked as required.  Indications: airway protection, respiratory failure  Intubation method:Glidescope Laryngoscopy   Preoxygenation: BVM  Sedatives: Etomidate Paralytic: Succinylcholine  Tube Size: 7.5 cuffed  Post-procedure assessment: chest rise and ETCO2 monitor Breath sounds: equal and absent over the epigastrium Tube secured with: ETT holder Chest x-ray interpreted by radiologist and me.  Chest x-ray findings: endotracheal tube in appropriate  position  Patient tolerated the procedure well with no immediate complications.   CRITICAL CARE Performed by: Armondo Cech Duo Bailen Geffre   Total critical care time: 60 minutes  Critical care time was exclusive of separately billable procedures and treating other patients.  Critical care was necessary to treat or prevent imminent or life-threatening deterioration.  Critical care was time spent personally by me on the following activities: development of treatment plan with patient and/or surrogate as well as nursing, discussions with consultants, evaluation of patient's response to treatment, examination of patient, obtaining history from patient or surrogate, ordering and performing treatments and interventions, ordering and review of laboratory studies, ordering and review of radiographic studies, pulse oximetry and re-evaluation of patient's condition.  Medications Ordered in ED Medications  naloxone (NARCAN) injection ( Intravenous Canceled Entry 01/10/2017 1830)  etomidate (AMIDATE) injection (20 mg Intravenous Given 01/19/2017 1757)  succinylcholine (ANECTINE) injection (100 mg Intravenous Given 01/12/2017 1757)  levETIRAcetam (KEPPRA) 1,000 mg in sodium chloride 0.9 % 100 mL IVPB (not administered)  pantoprazole (PROTONIX) injection 40 mg (40 mg Intravenous Given 01/12/2017 2250)  ondansetron (ZOFRAN) injection 4 mg (not administered)  nicardipine (CARDENE) 20mg in 0.86% saline 200ml IV infusion (0.1 mg/ml) (5 mg/hr Intravenous New Bag/Given 01/01/2017 0014)  fentaNYL (SUBLIMAZE) injection 50 mcg (not administered)  fentaNYL (SUBLIMAZE) injection 50 mcg (not administered)  propofol (DIPRIVAN) 1000 MG/100ML infusion (15 mcg/kg/min  81.6 kg Intravenous Rate/Dose Change 01/06/2017 2251)  potassium chloride 10 mEq in 100 mL IVPB (10 mEq Intravenous New Bag/Given 01/24/2017 0004)  chlorhexidine gluconate (MEDLINE KIT) (PERIDEX) 0.12 % solution 15 mL (15 mLs Mouth Rinse Given 12/31/2016 2311)  MEDLINE mouth rinse (15 mLs Mouth  Rinse Given 01/17/2017 0004)  levETIRAcetam (KEPPRA) 1,000 mg in sodium chloride 0.9 % 100 mL IVPB (0 mg Intravenous Stopped 01/22/2017 1842)  propofol (DIPRIVAN) 1000 MG/100ML infusion (  Stopped 01/04/2017 2107)  hydrALAZINE (APRESOLINE) injection 20 mg (10 mg Intravenous Given 01/08/2017 1809)  nicardipine (CARDENE) 20mg in 0.86% saline 200ml IV infusion (0.1 mg/ml) (0 mg/hr Intravenous Stopped 01/07/2017 2107)  calcium gluconate 1 g in sodium chloride 0.9 % 100 mL IVPB (0 g Intravenous Stopped 01/02/17 2358)     Initial Impression / Assessment and Plan / ED Course  I have reviewed the triage vital signs and the nursing notes.  Pertinent labs & imaging results that were available during my care of the patient were reviewed by me and considered in my medical decision making (see chart for details).     On arrival patient is unresponsive. Does have spontaneous breathing, but extensor posturing over LUE/LLE and no other meaningful neurological activity. Suspect intracranial hemorrhage, she is also extremely hypertensive with systolic blood pressures in the 230s. Patient is intubated for airway protection. 10 mg of hydralazine 2 was given for hypertension likely due to intracranial hemorrhage. A Keppra loading also administered given seizure activity prior to arrival per EMS.  CT head   visualized. With massive SAH, presumed aneurysmal. Placed on Nicardipine gtt for blood pressure control. Neurology and neurosurgery consulted. Admitted to ICU.   Final Clinical Impressions(s) / ED Diagnoses   Final diagnoses:  SAH (subarachnoid hemorrhage) (HCC)    New Prescriptions Current Discharge Medication List       Albany Winslow Duo, MD 01/14/2017 0023  

## 2017-01-02 NOTE — Code Documentation (Signed)
Per EMS,

## 2017-01-02 NOTE — ED Notes (Signed)
Pt flinched slightly when this RN started new PIV in RIGHT forarm; Liu MD notified of same

## 2017-01-02 NOTE — Code Documentation (Signed)
This RN, Neurologist and Respiratory transporting pt to CT 2.

## 2017-01-02 NOTE — ED Notes (Signed)
Vomiting x 1

## 2017-01-02 NOTE — Consult Note (Signed)
Reason for Consult:SAH Referring Physician: EDP  Sherri Hale is an 66 y.o. female.   HPI:  66 year old female at [facility] and became unresponsive after complaining of a headache. Witnessed seizure at [facility]. Brought in to the ED was unresponsive and posturing. Patient was intubated for airway protection. CT scan showed a diffuse sah and consulted for neurosurgical evaluation. Patient is unable to cooperate with history and physical exam as she intubated and sedated.   Past Medical History:  Diagnosis Date  . Hypertension     History reviewed. No pertinent surgical history.  No Known Allergies  Social History  Substance Use Topics  . Smoking status: Never Smoker  . Smokeless tobacco: Never Used  . Alcohol use No    History reviewed. No pertinent family history.   Review of Systems  Positive ROS: unable to obtain  All other systems have been reviewed and were otherwise negative with the exception of those mentioned in the HPI and as above.  Objective: Vital signs in last 24 hours: Temp:  [97.7 F (36.5 C)-97.9 F (36.6 C)] 97.9 F (36.6 C) (06/06 1930) Pulse Rate:  [59-106] 106 (06/06 1930) Resp:  [11-39] 22 (06/06 1930) BP: (140-258)/(57-198) 140/57 (06/06 1930) SpO2:  [97 %-100 %] 99 % (06/06 1930) FiO2 (%):  [40 %] 40 % (06/06 1801) Weight:  [81.6 kg (180 lb)] 81.6 kg (180 lb) (06/06 1750)  General Appearance:comatose, GCS 5T Head: Normocephalic, without obvious abnormality, atraumatic Eyes: PERRL 2 ->1 B, gaze conjugate     Neck: Supple Lungs: intubated Heart: tachy but reg Abdomen: Soft Extremities: Extremities no obvious abnormality   NEUROLOGIC:   Mental status: sedated and intubated Motor Exam - flexes R>L Sensory Exam - unable to test Reflexes: not tested Coordination - unable to test Gait - unable to test Balance - unable to test Cranial Nerves: I: smell Not tested  II: visual acuity  OS: na    OD: na  II: visual fields   II: pupils    III,VII: ptosis   III,IV,VI: extraocular muscles    V: mastication   V: facial light touch sensation    V,VII: corneal reflex  present  VII: facial muscle function - upper    VII: facial muscle function - lower   VIII: hearing   IX: soft palate elevation    IX,X: gag reflex present  XI: trapezius strength    XI: sternocleidomastoid strength   XI: neck flexion strength    XII: tongue strength      Data Review Lab Results  Component Value Date   WBC 12.9 (H) 2016-09-07   HGB 11.2 (L) 2016-09-07   HCT 33.0 (L) 2016-09-07   MCV 85.9 2016-09-07   PLT 236 2016-09-07   Lab Results  Component Value Date   NA 143 2016-09-07   K 3.4 (L) 2016-09-07   CL 108 2016-09-07   CO2 22 2016-09-07   BUN 23 (H) 2016-09-07   CREATININE 0.80 2016-09-07   GLUCOSE 162 (H) 2016-09-07   Lab Results  Component Value Date   INR 0.97 2016-09-07    Radiology: Ct Head Wo Contrast  Result Date: 01/19/2017 CLINICAL DATA:  Fall with vomiting. EXAM: CT HEAD WITHOUT CONTRAST TECHNIQUE: Contiguous axial images were obtained from the base of the skull through the vertex without intravenous contrast. COMPARISON:  None. FINDINGS: Brain: There is diffuse subarachnoid hemorrhage over both convexities and extending into the sylvian fissures and basal cisterns. The largest concentration of blood is along the anterior  interhemispheric fissure. It is unclear if there is some parenchymal component of this location as well. There is mass effect on the right lateral ventricle but no midline shift. Basal cisterns are effaced and there is crowding of the foramen magnum with early/impending herniation of cerebellar tonsils. There is intraventricular extension of blood within the lateral ventricles near the foramen of Monro and in the third ventricle. Vascular: No hyperdense vessel or unexpected calcification. Skull: Normal visualized skull base, calvarium and extracranial soft tissues. Sinuses/Orbits: No sinus fluid levels or  advanced mucosal thickening. No mastoid effusion. Normal orbits. IMPRESSION: 1. Massive subarachnoid hemorrhage over both convexities and within the sylvian fissures, basal cisterns and the lateral and third ventricles. Given the distribution, an aneurysmal etiology is favored, though other entities may also cause this distribution. However, it is somewhat unclear whether there may be a component of intraparenchymal hemorrhage of the inferomedial frontal lobe. CTA of the head is recommended. 2. Effacement of the basal cisterns and crowding of the foramen magnum with early/impending herniation of the cerebellar tonsils. Critical Value/emergent results were called by telephone at the time of interpretation on 01/04/2017 at 6:58 pm to Dr. Crista Curb , who verbally acknowledged these results. Electronically Signed   By: Deatra Robinson M.D.   On: 01/03/2017 19:07   Dg Chest Portable 1 View  Result Date: 12/31/2016 CLINICAL DATA:  Ventilator dependent respiratory failure. Intubation. EXAM: PORTABLE CHEST 1 VIEW COMPARISON:  None. FINDINGS: Endotracheal tube tip in satisfactory position projecting approximately 3 cm above the carina. Nasogastric tube courses below the diaphragm into the stomach. Cardiac silhouette mildly enlarged for AP portable technique. Perihilar airspace opacities are present. Lungs otherwise clear. No visible pleural effusions. No pneumothorax. Left subclavian dual lead transvenous pacemaker appears intact. IMPRESSION: 1. Support apparatus satisfactory. 2. Perihilar airspace opacities likely indicating mild pulmonary edema. Mild cardiomegaly. Electronically Signed   By: Hulan Saas M.D.   On: 01/01/2017 18:30     Assessment/Plan: 66 yo female with large SAH, grade IV, no hydro, suspect AComm...will need admit to ICU with CCM managing vent, aneurysm protocol, will need agram when stable.wil have Dr Conchita Paris, our neurovascular specialist eval in am   Sherri Hale S 01/03/2017 7:47 PM

## 2017-01-02 NOTE — Consult Note (Signed)
Neurology Consult Note  Reason for Consultation: Intracranial hemorrhage  Requesting provider: Crista Curbana Liu, MD  CC: Unable to obtain as the patient is comatose, intubated, and unable to provide  HPI: This is a 65-yo woman brought to the ED by EMS after she collapsed a [employer] this afternoon. History is per chart review as the patient is unable to provide and no family is present at the bedside.   Per notes, [facility] when she complained of a severe headache and became unresponsive. She had reported seizure activity and then vomited. EMS was activated and per attending EMTs the patient was briefly more alert and able to answer some questions for them before becoming unresponsive again. BP 300/palp for EMS.   On arrival in the ED, she was noted to have sonorous respirations and was unresponsive to verbal and noxious stimuli. She had extensor posturing. She was emergently intubated due to her inability to protect her airway. She remained severely hypertensive with BPs 230s/120s. She was given hydralazine 10 mg IV x2 and loaded with Keppra. She was taken for an emergent CTH which showed extensive subarachnoid hemorrhage with a large hematoma in the interhemispheric fissure.   PMH:  No past medical history on file. Unable to obtain as the patient is unresponsive and no family is present at the bedside.    PSH:  No past surgical history on file. Unable to obtain as the patient is unresponsive and no family is present at the bedside.     Family history: Unable to obtain as the patient is unresponsive and no family is present at the bedside.    No family history on file.  Social history: Unable to obtain as the patient is unresponsive and no family is present at the bedside.    Social History   Social History  . Marital status: N/A    Spouse name: N/A  . Number of children: N/A  . Years of education: N/A   Occupational History  . Not on file.   Social  History Main Topics  . Smoking status: Not on file  . Smokeless tobacco: Not on file  . Alcohol use Not on file  . Drug use: Unknown  . Sexual activity: Not on file   Other Topics Concern  . Not on file   Social History Narrative  . No narrative on file    Current inpatient meds: Medications reviewed and reconciled.  Current Facility-Administered Medications  Medication Dose Route Frequency Provider Last Rate Last Dose  . etomidate (AMIDATE) injection   Intravenous PRN Lavera GuiseLiu, Dana Duo, MD   20 mg at 21-May-2017 1757  . levETIRAcetam (KEPPRA) 1,000 mg in sodium chloride 0.9 % 100 mL IVPB  1,000 mg Intravenous Once Lavera GuiseLiu, Dana Duo, MD      . naloxone [facility](NARCAN) injection   Intravenous PRN Lavera GuiseLiu, Dana Duo, MD   2 mg at 21-May-2017 1754  . nicardipine (CARDENE) 20mg  in 0.86% saline 200ml IV infusion (0.1 mg/ml)  3-15 mg/hr Intravenous Once Lavera GuiseLiu, Dana Duo, MD      . succinylcholine (ANECTINE) injection   Intravenous PRN Lavera GuiseLiu, Dana Duo, MD   100 mg at 21-May-2017 1757   No current outpatient prescriptions on file.    Allergies: Allergies not on file  ROS: As per HPI. A full 14-point review of systems could not be obtained as she is comatose and intubated.   PE:  BP (!) 232/128   Pulse 64   Resp 19  Ht 5\' 5"  (1.651 m)   Wt 81.6 kg (180 lb)   SpO2 99%   BMI 29.95 kg/m   General: WDWN woman lying on ED gurney. She has spontaneous decorticate posturing. She is unresponsive to verbal and tactile stimulation. She is decerebrate to pain. Vomitus noted on sheet next to her head.  HEENT: Normocephalic. Neck supple without LAD. ETT in place. Sclerae anicteric. Mild conjunctival injection.  CV: Regular, no murmur. Carotid pulses full and symmetric, no bruits. Distal pulses 2+ and symmetric.  Lungs: CTAB on anterior exam.  Abdomen: Soft, non-distended, non-tender. Bowel sounds present x4.  Extremities: No C/C/E. Neuro:  CN: Pupils are equal and round. They are sluggishly reactive from 3-->2 mm with  rebound. She does not blink to visual threat. Her eyes are conjugate, no nystagmus. Oculocephalics are weak. Corneals are brisk bilaterally. Her face appears to be grossly symmetric but is partly obscured by tubes and tape. Absent gag. The remainder of her cranial nerves cannot be tested as she does not participate.  Motor: Normal bulk. She has spontaneous decorticate posturing bilaterally. Tone is increased throughout, particularly in the RLE which is rigidly extended.  Sensation: She is decerebrate to nailbed pressure in BUE and the RLE. She has some triple flexion to nailbed pressure in the LLE.   DTRs: 3+, symmetric. She has sustained clonus at the R ankle and greater then 10 beats of clonus at the L ankle. Toes upgoing bilaterally.  Coordination/gait: These cannot be assessed as she is unable to participate with examination.   Labs:  Lab Results  Component Value Date   HGB 11.2 (L) 01/11/2017   HCT 33.0 (L) 01/01/2017   GLUCOSE 162 (H) 01/26/2017   NA 143 01/11/2017   K 3.4 (L) 01/03/2017   CL 108 01/07/2017   CREATININE 0.80 01/12/2017   BUN 23 (H) 01/12/2017   EtOH <5 Coags normal Urine drug screen negative Urinalysis notable for protein 100 Troponin 0.00  Imaging:  I have personally and independently reviewed the CT scan of the head without contrast from today. This shows extensive subarachnoid hemorrhage overlying both cerebral hemispheres. This extends into the basal cisterns and into both sylvian fissures. There is a large collection of blood in the interhemispheric fissure anteriorly. Blood extends into the ventricular system involving both lateral ventricles, third ventricle, and the fourth ventricle. There is mass effect on the ventricular system but no obvious midline shift one way or the other. The basal cisterns are effaced. There appears to be evidence of early downward herniation through foramen magnum.  Assessment and Plan:  1. Subarachnoid hemorrhage: She has suffered  extensive subarachnoid hemorrhage. This is most likely aneurysmal in etiology and I suspect this represents either an anterior cerebral artery aneurysm or an anterior communicating artery aneurysm given prominent collection of blood in the anterior interhemispheric fissure in the region of the anterior cerebral arteries. It is hard to tell if there may be an element of intraparenchymal hemorrhage underlying the large interhemispheric hematoma. Given presentation and extent of hemorrhage, prognosis is poor. Recommend CT angiography to further evaluate for underlying aneurysm. Recommend stat neurosurgery consultation. Continue blood pressure management with goal systolic blood pressure 140 mmHg. Coags are normal, as are platelets. Agree with Keppra load for seizure prophylaxis.  2. Coma: This is acute, due to subarachnoid hemorrhage. Prognosis is poor. Supportive care.  3. Seizure: She had reported seizure-like activity at the onset of her symptoms. Agree with Keppra load, would continue.  This was discussed with the  ED attending, Dr. Verdie Mosher, at the time of consult. She will notify neurosurgery about the patient.  Thank you for this consultation. The stroke team will follow up beginning 01/01/2017. Please call with any urgent questions or concerns.  This patient is critically ill and at significant risk of neurological worsening, death and care requires constant monitoring of vital signs, hemodynamics,respiratory and cardiac monitoring, neurological assessment, discussion with family, other specialists and medical decision making of high complexity. A total of 60 minutes of critical care time was spent on this case.

## 2017-01-02 NOTE — Code Documentation (Signed)
Per EMS, pt was sitting at [employer] complaining of a ha, then went unresponsive. Upon EMS arrival pt was sitting up answering questions with family, had an episode of vomiting and went unresponsive again and began to have agonal rr's. Pt was grabbing at her head when she went unresponsive. EMS got a palpated BP of 300 systolic, HR 66 paced, etco2 34, CBG 135, assisted respirations with BVM and nasal trumpet in place. GCS 3 on arrival here.

## 2017-01-02 NOTE — ED Notes (Signed)
Vomiting from mouth x 1; suction at bedside

## 2017-01-02 NOTE — Progress Notes (Signed)
   22-Sep-2016 2000  Clinical Encounter Type  Visited With Patient and family together;Health care provider  Visit Type Initial;Spiritual support  Referral From Nurse  Consult/Referral To Chaplain  Spiritual Encounters  Spiritual Needs Prayer;Emotional  Stress Factors  Patient Stress Factors None identified  Family Stress Factors Exhausted;Family relationships;Lack of knowledge    Escorted family from consult A to patient bedside in Trauma B. Patient unresponsive. Provided emotional support, prayer and ministry of presence. Lavonn Maxcy L. Salomon FickBanks, MDiv

## 2017-01-03 ENCOUNTER — Inpatient Hospital Stay (HOSPITAL_COMMUNITY): Payer: Medicare HMO

## 2017-01-03 ENCOUNTER — Inpatient Hospital Stay (HOSPITAL_COMMUNITY): Payer: Medicare HMO | Admitting: Certified Registered Nurse Anesthetist

## 2017-01-03 ENCOUNTER — Encounter (HOSPITAL_COMMUNITY): Payer: Self-pay | Admitting: Certified Registered Nurse Anesthetist

## 2017-01-03 ENCOUNTER — Encounter (HOSPITAL_COMMUNITY): Admission: EM | Disposition: E | Payer: Self-pay | Source: Home / Self Care | Attending: Pulmonary Disease

## 2017-01-03 DIAGNOSIS — R402431 Glasgow coma scale score 3-8, in the field [EMT or ambulance]: Secondary | ICD-10-CM

## 2017-01-03 DIAGNOSIS — R293 Abnormal posture: Secondary | ICD-10-CM

## 2017-01-03 DIAGNOSIS — J9600 Acute respiratory failure, unspecified whether with hypoxia or hypercapnia: Secondary | ICD-10-CM

## 2017-01-03 HISTORY — PX: IR ANGIO INTRA EXTRACRAN SEL INTERNAL CAROTID BILAT MOD SED: IMG5363

## 2017-01-03 HISTORY — PX: IR ANGIO VERTEBRAL SEL VERTEBRAL UNI L MOD SED: IMG5367

## 2017-01-03 HISTORY — PX: IR TRANSCATH/EMBOLIZ: IMG695

## 2017-01-03 HISTORY — PX: IR ANGIOGRAM FOLLOW UP STUDY: IMG697

## 2017-01-03 HISTORY — PX: IR NEURO EACH ADD'L AFTER BASIC UNI RIGHT (MS): IMG5374

## 2017-01-03 HISTORY — PX: RADIOLOGY WITH ANESTHESIA: SHX6223

## 2017-01-03 LAB — BASIC METABOLIC PANEL
ANION GAP: 14 (ref 5–15)
BUN: 15 mg/dL (ref 6–20)
CALCIUM: 9 mg/dL (ref 8.9–10.3)
CO2: 20 mmol/L — ABNORMAL LOW (ref 22–32)
Chloride: 106 mmol/L (ref 101–111)
Creatinine, Ser: 0.87 mg/dL (ref 0.44–1.00)
Glucose, Bld: 195 mg/dL — ABNORMAL HIGH (ref 65–99)
POTASSIUM: 3.9 mmol/L (ref 3.5–5.1)
Sodium: 140 mmol/L (ref 135–145)

## 2017-01-03 LAB — BLOOD GAS, ARTERIAL
Acid-base deficit: 1.9 mmol/L (ref 0.0–2.0)
BICARBONATE: 21.6 mmol/L (ref 20.0–28.0)
Drawn by: 419771
FIO2: 40
LHR: 20 {breaths}/min
O2 Saturation: 98.7 %
PEEP: 5 cmH2O
Patient temperature: 98.6
VT: 490 mL
pCO2 arterial: 32.3 mmHg (ref 32.0–48.0)
pH, Arterial: 7.441 (ref 7.350–7.450)
pO2, Arterial: 137 mmHg — ABNORMAL HIGH (ref 83.0–108.0)

## 2017-01-03 LAB — CBC
HEMATOCRIT: 37.4 % (ref 36.0–46.0)
Hemoglobin: 12.6 g/dL (ref 12.0–15.0)
MCH: 28.6 pg (ref 26.0–34.0)
MCHC: 33.7 g/dL (ref 30.0–36.0)
MCV: 84.8 fL (ref 78.0–100.0)
PLATELETS: 211 10*3/uL (ref 150–400)
RBC: 4.41 MIL/uL (ref 3.87–5.11)
RDW: 12.6 % (ref 11.5–15.5)
WBC: 12.5 10*3/uL — AB (ref 4.0–10.5)

## 2017-01-03 LAB — MRSA PCR SCREENING: MRSA BY PCR: INVALID — AB

## 2017-01-03 LAB — PHOSPHORUS: PHOSPHORUS: 1.8 mg/dL — AB (ref 2.5–4.6)

## 2017-01-03 LAB — OSMOLALITY, URINE: Osmolality, Ur: 679 mOsm/kg (ref 300–900)

## 2017-01-03 LAB — TYPE AND SCREEN
ABO/RH(D): O POS
Antibody Screen: NEGATIVE

## 2017-01-03 LAB — ABO/RH: ABO/RH(D): O POS

## 2017-01-03 LAB — MAGNESIUM: Magnesium: 1.6 mg/dL — ABNORMAL LOW (ref 1.7–2.4)

## 2017-01-03 LAB — PATHOLOGIST SMEAR REVIEW

## 2017-01-03 SURGERY — RADIOLOGY WITH ANESTHESIA
Anesthesia: General

## 2017-01-03 MED ORDER — SODIUM PHOSPHATES 45 MMOLE/15ML IV SOLN
20.0000 mmol | Freq: Once | INTRAVENOUS | Status: AC
  Administered 2017-01-03: 20 mmol via INTRAVENOUS
  Filled 2017-01-03: qty 6.67

## 2017-01-03 MED ORDER — IOPAMIDOL (ISOVUE-300) INJECTION 61%
INTRAVENOUS | Status: AC
  Filled 2017-01-03: qty 100

## 2017-01-03 MED ORDER — MAGNESIUM SULFATE 2 GM/50ML IV SOLN
2.0000 g | Freq: Once | INTRAVENOUS | Status: AC
  Administered 2017-01-03: 2 g via INTRAVENOUS
  Filled 2017-01-03: qty 50

## 2017-01-03 MED ORDER — PROPOFOL 500 MG/50ML IV EMUL
INTRAVENOUS | Status: DC | PRN
  Administered 2017-01-03: 50 ug/kg/min via INTRAVENOUS

## 2017-01-03 MED ORDER — EPHEDRINE SULFATE 50 MG/ML IJ SOLN
INTRAMUSCULAR | Status: DC | PRN
  Administered 2017-01-03: 10 mg via INTRAVENOUS

## 2017-01-03 MED ORDER — ROCURONIUM BROMIDE 100 MG/10ML IV SOLN
INTRAVENOUS | Status: DC | PRN
  Administered 2017-01-03: 30 mg via INTRAVENOUS
  Administered 2017-01-03: 20 mg via INTRAVENOUS
  Administered 2017-01-03: 30 mg via INTRAVENOUS

## 2017-01-03 MED ORDER — NIMODIPINE 60 MG/20ML PO SOLN
60.0000 mg | ORAL | Status: DC
  Administered 2017-01-03 – 2017-01-13 (×61): 60 mg
  Filled 2017-01-03 (×61): qty 20

## 2017-01-03 MED ORDER — IOPAMIDOL (ISOVUE-300) INJECTION 61%
INTRAVENOUS | Status: AC
  Filled 2017-01-03: qty 300

## 2017-01-03 MED ORDER — MIDAZOLAM HCL 5 MG/5ML IJ SOLN
INTRAMUSCULAR | Status: DC | PRN
  Administered 2017-01-03: 2 mg via INTRAVENOUS

## 2017-01-03 MED ORDER — SODIUM CHLORIDE 0.9 % IV SOLN
INTRAVENOUS | Status: DC | PRN
  Administered 2017-01-03: 15:00:00 via INTRAVENOUS

## 2017-01-03 MED ORDER — PROPOFOL 10 MG/ML IV BOLUS
INTRAVENOUS | Status: DC | PRN
  Administered 2017-01-03: 70 mg via INTRAVENOUS
  Administered 2017-01-03 (×2): 20 mg via INTRAVENOUS

## 2017-01-03 MED ORDER — FENTANYL CITRATE (PF) 100 MCG/2ML IJ SOLN
INTRAMUSCULAR | Status: DC | PRN
  Administered 2017-01-03 (×2): 50 ug via INTRAVENOUS

## 2017-01-03 MED ORDER — SODIUM CHLORIDE 0.9 % IV SOLN
INTRAVENOUS | Status: DC
  Administered 2017-01-03 – 2017-01-05 (×5): via INTRAVENOUS
  Administered 2017-01-05: 1000 mL via INTRAVENOUS
  Administered 2017-01-05 (×2): via INTRAVENOUS
  Administered 2017-01-06: 1000 mL via INTRAVENOUS

## 2017-01-03 MED ORDER — IOPAMIDOL (ISOVUE-300) INJECTION 61%
150.0000 mL | Freq: Once | INTRAVENOUS | Status: AC | PRN
  Administered 2017-01-03: 120 mL via INTRA_ARTERIAL

## 2017-01-03 MED ORDER — PHENYLEPHRINE HCL 10 MG/ML IJ SOLN
INTRAVENOUS | Status: DC | PRN
  Administered 2017-01-03: 30 ug/min via INTRAVENOUS

## 2017-01-03 MED ORDER — PNEUMOCOCCAL VAC POLYVALENT 25 MCG/0.5ML IJ INJ
0.5000 mL | INJECTION | INTRAMUSCULAR | Status: AC
  Administered 2017-01-04: 0.5 mL via INTRAMUSCULAR
  Filled 2017-01-03: qty 0.5

## 2017-01-03 MED ORDER — NIMODIPINE 30 MG PO CAPS: 60.0000 mg | ORAL_CAPSULE | ORAL | Status: DC

## 2017-01-03 NOTE — Progress Notes (Addendum)
PULMONARY / CRITICAL CARE MEDICINE   Name: Sherri Hale MRN: [id number] DOB: [birth year]    ADMISSION DATE:  01/09/2017 CONSULTATION DATE:  6/6  REFERRING MD:  Dr. Verdie Mosher EDP  CHIEF COMPLAINT:  SAH  HISTORY OF PRESENT ILLNESS: Patient is encephalopathic and/or intubated. Therefore history has been obtained from chart review.   66 year old female with past medical history of hypertension who presented to [facility] emergency department 6/6 of the EMS after collapsing at [facility]. While at the [facility] she was complaining of severe headache and then suddenly became unresponsive. She didn't mature to seizure activity and subsequently vomited. She did briefly have some return of consciousness, however, this was short-lived and she was unresponsive on arrival to the emergency department. She was intubated for airway protection. She was profoundly hypertensive with systolic blood pressures in the 230s. In the emergency department she was started on nicardipine infusion and loaded with IV Keppra. She was taken for a CT scan of the head which showed extensive subarachnoid hemorrhage. She was evaluated by neurosurgery in the emergency department who will reevaluate her once more stable. Likely in the morning. PCCM has been asked to admit to ICU.   SUBJECTIVE:  Events overnight:  Intermittent left arm tremor/flexion  Afebrile  VITAL SIGNS: Temp:  [97 F (36.1 C)-97.9 F (36.6 C)] 97.3 F (36.3 C) (06/06 2131) Pulse Rate:  [59-121] 60 (06/07 0600) Resp:  [11-39] 20 (06/07 0600) BP: (99-258)/(44-198) 120/63 (06/07 0600) SpO2:  [96 %-100 %] 100 % (06/07 0600) FiO2 (%):  [40 %] 40 % (06/07 0400) Weight:  [180 lb (81.6 kg)] 180 lb (81.6 kg) (06/06 1750)  HEMODYNAMICS:    VENTILATOR SETTINGS: Vent Mode: PRVC FiO2 (%):  [40 %] 40 % Set Rate:  [20 bmp] 20 bmp Vt Set:  [490 mL] 490 mL PEEP:  [5 cmH20] 5 cmH20 Plateau Pressure:  [16 cmH20-17 cmH20] 17 cmH20  INTAKE /  OUTPUT:  Intake/Output Summary (Last 24 hours) at 12-Jan-2017 4782 Last data filed at 01/12/2017 0600  Gross per 24 hour  Intake           944.96 ml  Output             2625 ml  Net         -1680.04 ml     PHYSICAL EXAMINATION: General appearance:  66 Year old  female, well nourished unresponsive Eyes: anicteric sclerae, moist conjunctivae; PERRL, EOMI bilaterally. Mouth:  membranes and no mucosal ulcerations; orally intubated Neck: Trachea midline; neck supple, no JVD Lungs/chest: CTA, with normal respiratory effort and no intercostal retractions CV: RRR, no MRGs  Abdomen: Soft, non-tender; no masses or HSM Extremities: No peripheral edema or extremity lymphadenopathy Skin: Normal temperature, turgor and texture; no rash, ulcers or subcutaneous nodules Neuro/ Psych: responds only to pain. Left arm contraction/tremor no purposeful   LABS:  BMET  Recent Labs Lab 12/28/2016 1810 01/16/2017 1824 01/12/2017 0248  NA 139 143 140  K 3.3* 3.4* 3.9  CL 110 108 106  CO2 22  --  20*  BUN 20 23* 15  CREATININE 0.83 0.80 0.87  GLUCOSE 165* 162* 195*    Electrolytes  Recent Labs Lab 12/30/2016 1810 12-Jan-2017 0248  CALCIUM 8.1* 9.0  MG  --  1.6*  PHOS  --  1.8*    CBC  Recent Labs Lab 12/28/2016 1810 12/31/2016 1824 2017-01-12 0248  WBC 12.9*  --  12.5*  HGB 11.9* 11.2* 12.6  HCT 36.0 33.0* 37.4  PLT 236  --  211    Coag's  Recent Labs Lab 01/01/2017 1810  APTT 34  INR 0.97    Sepsis Markers No results for input(s): LATICACIDVEN, PROCALCITON, O2SATVEN in the last 168 hours.  ABG  Recent Labs Lab 01/23/2017 1931 01/19/2017 0350  PHART 7.471* 7.441  PCO2ART 34.7 32.3  PO2ART 357.0* 137*    Liver Enzymes  Recent Labs Lab 01/17/2017 1810  AST 67*  ALT 64*  ALKPHOS 107  BILITOT 0.8  ALBUMIN 3.7    Cardiac Enzymes No results for input(s): TROPONINI, PROBNP in the last 168 hours.  Glucose No results for input(s): GLUCAP in the last 168 hours.  Imaging Ct Head  Wo Contrast  Result Date: 01/07/2017 CLINICAL DATA:  Fall with vomiting. EXAM: CT HEAD WITHOUT CONTRAST TECHNIQUE: Contiguous axial images were obtained from the base of the skull through the vertex without intravenous contrast. COMPARISON:  None. FINDINGS: Brain: There is diffuse subarachnoid hemorrhage over both convexities and extending into the sylvian fissures and basal cisterns. The largest concentration of blood is along the anterior interhemispheric fissure. It is unclear if there is some parenchymal component of this location as well. There is mass effect on the right lateral ventricle but no midline shift. Basal cisterns are effaced and there is crowding of the foramen magnum with early/impending herniation of cerebellar tonsils. There is intraventricular extension of blood within the lateral ventricles near the foramen of Monro and in the third ventricle. Vascular: No hyperdense vessel or unexpected calcification. Skull: Normal visualized skull base, calvarium and extracranial soft tissues. Sinuses/Orbits: No sinus fluid levels or advanced mucosal thickening. No mastoid effusion. Normal orbits. IMPRESSION: 1. Massive subarachnoid hemorrhage over both convexities and within the sylvian fissures, basal cisterns and the lateral and third ventricles. Given the distribution, an aneurysmal etiology is favored, though other entities may also cause this distribution. However, it is somewhat unclear whether there may be a component of intraparenchymal hemorrhage of the inferomedial frontal lobe. CTA of the head is recommended. 2. Effacement of the basal cisterns and crowding of the foramen magnum with early/impending herniation of the cerebellar tonsils. Critical Value/emergent results were called by telephone at the time of interpretation on 01/04/2017 at 6:58 pm to Dr. Crista Curb , who verbally acknowledged these results. Electronically Signed   By: Deatra Robinson M.D.   On: 01/19/2017 19:07   Dg Chest Portable 1  View  Result Date: 01/05/2017 CLINICAL DATA:  Ventilator dependent respiratory failure. Intubation. EXAM: PORTABLE CHEST 1 VIEW COMPARISON:  None. FINDINGS: Endotracheal tube tip in satisfactory position projecting approximately 3 cm above the carina. Nasogastric tube courses below the diaphragm into the stomach. Cardiac silhouette mildly enlarged for AP portable technique. Perihilar airspace opacities are present. Lungs otherwise clear. No visible pleural effusions. No pneumothorax. Left subclavian dual lead transvenous pacemaker appears intact. IMPRESSION: 1. Support apparatus satisfactory. 2. Perihilar airspace opacities likely indicating mild pulmonary edema. Mild cardiomegaly. Electronically Signed   By: Hulan Saas M.D.   On: 01/06/2017 18:30     STUDIES:  6/6 CT head > Massive subarachnoid hemorrhage over both convexities and within the sylvian fissures, basal cisterns and the lateral and third ventricles. Given the distribution, an aneurysmal etiology is favored, though other entities may also cause this distribution. However, it is somewhat unclear whether there may be a component of intraparenchymal hemorrhage of the inferomedial frontal lobe. CTA of the head is recommended. Effacement of the basal cisterns and crowding of the foramen magnum with early/impending  herniation of the cerebellar tonsils.  CULTURES:   ANTIBIOTICS:   SIGNIFICANT EVENTS: 6/6 admit  LINES/TUBES: ett 6/6 >  DISCUSSION: SAH w/ inability to protect airway. 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  Keppra Nimodipine q4 Defer arteriogram timing to N surg  PULMONARY A: Airway compromise secondary to SAH (grade IV) P:   Cont full vent support PAD protocol for RAS -1 to -2  CARDIOVASCULAR A:  Hypertensive  emergency P:  Tele SBP goal 140-160 (cont nicardipine) Holding home losartan and hctz Scheduled nimodipine   RENAL A:   Fluid and electrolyte imbalance  P:  Daily chem Replace as needed  NS @ 100 Replace PO4 & Mg  GASTROINTESTINAL A:   No acute issues P:   NPO OGT to LIS Zofran for vomiting  HEMATOLOGIC A:   Anemia P:  Follow CBC SCDs only  INFECTIOUS A:   Possible aspiration P:   Monitor off ABX  ENDOCRINE A:   Hyperglycemia P:   SSI  FAMILY  - Updates: husband updated in ED  - Inter-disciplinary family meet or Palliative Care meeting due by:  6/12  My ccm time 35 minutes  Simonne MartinetPeter E Babcock ACNP-BC [facility] Pulmonary/Critical Care Pager # [phone] OR # [phone] if no answer  01/17/2017 6:22 AM  STAFF NOTE: Cindi CarbonI, Colbie Danner, MD FACP have personally reviewed patient's available data, including medical history, events of note, physical examination and test results as part of my evaluation. I have discussed with resident/NP and other care providers such as pharmacist, RN and RRT. In addition, I personally evaluated patient and elicited key findings of: not awake, not fc, posturing and myoclonus on left  Arm, lungs clear, eyes are deviated down, I reviewed pcxr which is improved and no infiltrate , I reviewed CT with MASSIVE SAH and impending herniation with poor HUNT hess classification - 5, ABG reviewed keep about same MV maybe slight down, no sbt, possible angio per NS today, she put out neg balance, may be to DI, obtain urine osm and follow NA, requires cardene for now, supp lytes, phos, mag, nimod added, allow pos balance, may need saline increase, appears to be poor prognosis, consider eeg and 21 days keppra, will feed in am after procedure, no family at bedside, WUA prop mandatory The patient is critically ill with multiple organ systems failure and requires high complexity decision making for assessment and support, frequent evaluation and titration of  therapies, application of advanced monitoring technologies and extensive interpretation of multiple databases.   Critical Care Time devoted to patient care services described in this note is 30 Minutes. This time reflects time of care of this signee: Rory Percyaniel Jaleel Allen, MD FACP. This critical care time does not reflect procedure time, or teaching time or supervisory time of PA/NP/Med student/Med Resident etc but could involve care discussion time. Rest per NP/medical resident whose note is outlined above and that I agree with   Mcarthur Rossettianiel J. Tyson AliasFeinstein, MD, FACP Pgr: [phone] [facility] 12/29/2016 10:37 AM     A

## 2017-01-03 NOTE — Progress Notes (Signed)
TGL 279  Plan Ok to continue diprivan for now  Dr. Kalman ShanMurali Ebin Palazzi, M.D., Mental Health InstituteF.C.C.P Pulmonary and Critical Care Medicine Staff Physician [facility] Pulmonary and Critical Care Pager: [phone], If no answer or between  15:00h - 7:00h: call [phone]  01/19/2017 5:42 AM

## 2017-01-03 NOTE — Progress Notes (Signed)
Pts admission history reviewed. Became unresponsive after sudden onset severe HA. Required intubation. No hx of smoking, no FH of aneurysms.  EXAM:  BP 107/77   Pulse 72   Temp 97.3 F (36.3 C)   Resp 18   Ht 5\' 3"  (1.6 m)   Wt 72.2 kg (159 lb 2.8 oz)   LMP  (LMP Unknown)   SpO2 100%   BMI 28.20 kg/m   Intubated No eye opening to pain Pupils 3mm, reactive OU Breathes over vent (+) cough/gag Flexion posturing BUE to noxious stim   IMAGING: CTH reviewed with diffuse SAH including thick interhemispheric clot extending around the genu and body of corpus callosum. No ventriculomegaly.  IMPRESSION:  66 y.o. female Hunt-Hess 5, Fisher 3/4. 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   PLAN: - Will plan on proceeding the diagnostic angiogram and treatment of any identified aneurysm.  I spoke at length with the patient's brother and daughter regarding the imaging findings thus far. I explained to them that intracranial aneurysm was the most common non-traumatic cause for [facility] and that the definitive diagnosis is made by diagnostic angiogram. I also explained to them the possible treatment options for intracranial aneurysms including endovascular coiling and open clip ligation. The risks of the angiogram, coiling, and surgical clipping were also reviewed to include but not limited to stroke and aneurysm re-rupture leading to weakness/paralysis/coma/death.  The patient's family appears to understood our discussion and they provided consent to proceed with diagnostic angiogram and the appropriate treatment for any identified aneurysm.

## 2017-01-03 NOTE — Progress Notes (Signed)
Stopped by to visit in response to consult for major life transtitions, but no family were in rm and med staff was conferring re: pt outside rm. Offered silent prayer for pt. Will stop back by later.    05-20-2017 1000  Clinical Encounter Type  Visited With Patient not available  Visit Type Initial;Critical Care  Referral From Nurse   Ephraim Hamburgerynthia A Namiko Pritts, Chaplain

## 2017-01-03 NOTE — Progress Notes (Signed)
Neurology made aware of left arm tremor that happens at random but also with pain along with flexion of arms. Thought at first to be w/d to pain but confirmed to be flexion. No new orders at this time. Will continue to monitor.

## 2017-01-03 NOTE — Progress Notes (Signed)
Portable EEG completed, results pending. 

## 2017-01-03 NOTE — Care Management Note (Signed)
Case Management Note  Patient Details  Name: Sherri Hale MRN: [id number] Date of Birth: 1951/07/10  Subjective/Objective:                 [facility] HTN proceeding SAH. Plan for diagnostic angiogram and treatment of any identified aneurysm. Intubated. Plan and GOC will be pending results of angiogram.    Action/Plan:  CM will continue to follow.  Expected Discharge Date:                  Expected Discharge Plan:     In-House Referral:     Discharge planning Services  CM Consult  Post Acute Care Choice:    Choice offered to:     DME Arranged:    DME Agency:     HH Arranged:    HH Agency:     Status of Service:  In process, will continue to follow  If discussed at Long Length of Stay Meetings, dates discussed:    Additional Comments:  Lawerance SabalDebbie Eran Windish, RN 01/23/2017, 12:13 PM

## 2017-01-03 NOTE — Anesthesia Preprocedure Evaluation (Signed)
Anesthesia Evaluation  Patient identified by MRN, date of birth, ID band Patient unresponsive    Reviewed: Allergy & Precautions, NPO status , Patient's Chart, lab work & pertinent test results  Airway Mallampati: Intubated       Dental no notable dental hx.    Pulmonary  Intubated   Pulmonary exam normal    + intubated    Cardiovascular hypertension, Pt. on medications Normal cardiovascular exam     Neuro/Psych CVA, Residual Symptoms negative psych ROS   GI/Hepatic negative GI ROS,   Endo/Other  negative endocrine ROS  Renal/GU negative Renal ROS  negative genitourinary   Musculoskeletal negative musculoskeletal ROS (+)   Abdominal   Peds  Hematology   Anesthesia Other Findings   Reproductive/Obstetrics                             Anesthesia Physical Anesthesia Plan  ASA: III  Anesthesia Plan: General   Post-op Pain Management:    Induction: Inhalational  PONV Risk Score and Plan: 3 and Ondansetron, Dexamethasone, Propofol, Midazolam and Treatment may vary due to age  Airway Management Planned: Oral ETT  Additional Equipment:   Intra-op Plan:   Post-operative Plan: Post-operative intubation/ventilation  Informed Consent: I have reviewed the patients History and Physical, chart, labs and discussed the procedure including the risks, benefits and alternatives for the proposed anesthesia with the patient or authorized representative who has indicated his/her understanding and acceptance.   History available from chart only  Plan Discussed with: CRNA and Surgeon  Anesthesia Plan Comments:         Anesthesia Quick Evaluation

## 2017-01-03 NOTE — Progress Notes (Signed)
Patient dumped over in urine from 4am-6am. Urine specific gravity spot checked 1.0046. Slightly low, compared to previous specific gravity taken on 6/6 1.017. eLink MD made aware, no new orders. Will continue to monitor.

## 2017-01-03 NOTE — Progress Notes (Signed)
STROKE TEAM PROGRESS NOTE   HISTORY OF PRESENT ILLNESS (per record) This is a 65-yo woman brought to the ED by EMS after she collapsed a [employer] this afternoon. History is per chart review as the patient is unable to provide and no family is present at the bedside.   Per notes, [facility] when she complained of a severe headache and became unresponsive. She had reported seizure activity and then vomited. EMS was activated and per attending EMTs the patient was briefly more alert and able to answer some questions for them before becoming unresponsive again. BP 300/palp for EMS.   On arrival in the ED, she was noted to have sonorous respirations and was unresponsive to verbal and noxious stimuli. She had extensor posturing. She was emergently intubated due to her inability to protect her airway. She remained severely hypertensive with BPs 230s/120s. She was given hydralazine 10 mg IV x2 and loaded with Keppra. She was taken for an emergent CTH which showed extensive subarachnoid hemorrhage with a large hematoma in the interhemispheric fissure. NS was consulted. Plan NS neurovascular assessment in the am. She was admitted to the neuro ICU.   SUBJECTIVE (INTERVAL HISTORY) No family at bedside. Pt intubated on propofol, not following commands. With central pain stimulation, she has decorticate posturing with left forearm unsustained myoclonus. With local pain stimulation, she has mild withdraw on BLEs. Plan for angio today with Dr. Conchita ParisNundkumar.   OBJECTIVE Temp:  [97 F (36.1 C)-97.9 F (36.6 C)] 97.3 F (36.3 C) (06/06 2131) Pulse Rate:  [59-121] 75 (06/07 1000) Cardiac Rhythm: Normal sinus rhythm (06/07 0400) Resp:  [11-39] 16 (06/07 1000) BP: (99-258)/(44-198) 173/74 (06/07 1000) SpO2:  [96 %-100 %] 100 % (06/07 1000) FiO2 (%):  [40 %] 40 % (06/07 0848) Weight:  [72.2 kg (159 lb 2.8 oz)-81.6 kg (180 lb)] 72.2 kg (159 lb 2.8 oz) (06/07 0500)  CBC:   Recent Labs Lab 03/27/2017 1810 03/27/2017 1824 01/15/2017 0248  WBC 12.9*  --  12.5*  NEUTROABS 5.3  --   --   HGB 11.9* 11.2* 12.6  HCT 36.0 33.0* 37.4  MCV 85.9  --  84.8  PLT 236  --  211    Basic Metabolic Panel:  Recent Labs Lab 03/27/2017 1810 03/27/2017 1824 01/15/2017 0248  NA 139 143 140  K 3.3* 3.4* 3.9  CL 110 108 106  CO2 22  --  20*  GLUCOSE 165* 162* 195*  BUN 20 23* 15  CREATININE 0.83 0.80 0.87  CALCIUM 8.1*  --  9.0  MG  --   --  1.6*  PHOS  --   --  1.8*    Lipid Panel:    Component Value Date/Time   TRIG 279 (H) 02/14/2017 2225   HgbA1c: No results found for: HGBA1C Urine Drug Screen:    Component Value Date/Time   LABOPIA NONE DETECTED 02/14/2017 1821   COCAINSCRNUR NONE DETECTED 02/14/2017 1821   LABBENZ NONE DETECTED 02/14/2017 1821   AMPHETMU NONE DETECTED 02/14/2017 1821   THCU NONE DETECTED 02/14/2017 1821   LABBARB NONE DETECTED 02/14/2017 1821    Alcohol Level     Component Value Date/Time   ETH <5 02/14/2017 1810    IMAGING I have personally reviewed the radiological images below and agree with the radiology interpretations.  Ct Head Wo Contrast 01/04/2017 1. Massive subarachnoid hemorrhage over both convexities and within the sylvian fissures, basal cisterns and the lateral and third ventricles.  Given the distribution, an aneurysmal etiology is favored, though other entities may also cause this distribution. However, it is somewhat unclear whether there may be a component of intraparenchymal hemorrhage of the inferomedial frontal lobe. CTA of the head is recommended. 2. Effacement of the basal cisterns and crowding of the foramen magnum with early/impending herniation of the cerebellar tonsils.    PHYSICAL EXAM  Temp:  [97 F (36.1 C)-97.9 F (36.6 C)] 97.3 F (36.3 C) (06/06 2131) Pulse Rate:  [59-121] 59 (06/07 1232) Resp:  [11-39] 18 (06/07 1232) BP: (99-258)/(44-198) 146/86 (06/07 1247) SpO2:  [96 %-100 %] 100 % (06/07  1247) FiO2 (%):  [40 %] 40 % (06/07 1247) Weight:  [159 lb 2.8 oz (72.2 kg)-180 lb (81.6 kg)] 159 lb 2.8 oz (72.2 kg) (06/07 0500)  General - Well nourished, well developed, intubated on sedation.  Ophthalmologic - Fundi not visualized due to small pupils.  Cardiovascular - Regular rate and rhythm.  Neuro - intubated on sedation, not open eyes or following commands. Eyes middle position, conjugate, but no doll's eyes. Pupils 2mm equal round, but very sluggish to light. Weakness corneals but positive gag. Breathing over the vent. On central pain stimulation, she has decorticate posturing with left forearm unsustained myoclonus. With local LE pain stimulation, she has mild withdraw on BLEs. Increased tone bilaterally, DTR 1+ and no babinski. Sensation, coordination and gait not tested.   ASSESSMENT/PLAN [name] is a 66 y.o. female with history of HTN presenting with severe HA which progressed to unresponsive state, seizure activity and vomiting. CT showed a extensive SAH.   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 Effacement of the basal cisterns with early/impending cerebellar herniation  Cerebral angio pending  NSG on board  TCD pending  TTE pending  EEG pending  SCDs for VTE prophylaxis  Diet NPO time specified  No antithrombotic prior to admission  Ongoing aggressive stroke risk factor management  Therapy recommendations:  pending   Disposition:  Pending. Due to decorticate posturing with extensive grade IV SAH, fisher grade III-IV and HH greade 5, prognosis is likely very poor.   Respiratory failure  Secondary to [facility]  Intubated in ED  managemed by CCM  Hypertensive Emergency  SBP 300 palp on scene  Started on cardene for BP control  SBP goal < 160   Currently BP at goal.    ? Seizure - more likely stimulation induced decorticate  posturing with unsustained LUE myoclonus   High risk for seizure  on Keppra 1000mg  bid  EEG pending  Seizure precuation  Other Stroke Risk Factors  Advanced age  Other Active Problems  hypocalcemia  Hypokalemia  Hospital day # 1  This patient is critically ill due to extensive SAH and coma with posturing and at significant risk of neurological worsening, death form recurrent hemorrhage, hydrocephalus, seizure, and vasospasm. This patient's care requires constant monitoring of vital signs, hemodynamics, respiratory and cardiac monitoring, review of multiple databases, neurological assessment, discussion with family, other specialists and medical decision making of high complexity. I spent 35 minutes of neurocritical care time in the care of this patient.  Marvel Plan, MD PhD Stroke Neurology 01-09-17 1:10 PM  To contact Stroke Continuity provider, please refer to [email or web address]. After hours, contact General Neurology

## 2017-01-03 NOTE — Transfer of Care (Signed)
Immediate Anesthesia Transfer of Care Note  Patient: Sherri Hale  Procedure(s) Performed: Procedure(s): RADIOLOGY WITH ANESTHESIA (N/A)  Patient Location: ICU  Anesthesia Type:General  Level of Consciousness: Patient remains intubated per anesthesia plan  Airway & Oxygen Therapy: Patient remains intubated per anesthesia plan and Patient placed on Ventilator (see vital sign flow sheet for setting)  Post-op Assessment: Report given to RN and Post -op Vital signs reviewed and stable  Post vital signs: Reviewed and stable  Last Vitals:  Vitals:   2017/03/03 1400 2017/03/03 1430  BP: 130/61 134/67  Pulse: 64 61  Resp: 18 (!) 22  Temp:      Last Pain:  Vitals:   12/30/2016 1755  TempSrc: Axillary         Complications: No apparent anesthesia complications

## 2017-01-03 NOTE — Procedures (Signed)
Electroencephalogram (EEG) Report  Date of study: 01/16/2017  Requesting clinician: Merrie Roof, M.D.  Reason for study: Evaluate for seizure  Brief clinical history: This is a 66 year old woman admitted with large subarachnoid hemorrhage. EEG is being performed for further evaluation.  Medications:  Current Facility-Administered Medications:  .  0.9 %  sodium chloride infusion, , Intravenous, Continuous, Raylene Miyamoto, MD, Last Rate: 150 mL/hr at 01/20/2017 1500 .  chlorhexidine gluconate (MEDLINE KIT) (PERIDEX) 0.12 % solution 15 mL, 15 mL, Mouth Rinse, BID, de Dios, Banning A, MD, 15 mL at 01/19/2017 [phone] .  fentaNYL (SUBLIMAZE) injection 50 mcg, 50 mcg, Intravenous, Q15 min PRN, Corey Harold, NP .  fentaNYL (SUBLIMAZE) injection 50 mcg, 50 mcg, Intravenous, Q2H PRN, Corey Harold, NP .  iopamidol (ISOVUE-300) 61 % injection, , , ,  .  levETIRAcetam (KEPPRA) 1,000 mg in sodium chloride 0.9 % 100 mL IVPB, 1,000 mg, Intravenous, Q12H, Darrel Reach, MD, Stopped at 01/12/2017 [phone] .  MEDLINE mouth rinse, 15 mL, Mouth Rinse, 10 times per day, de Dios, [place], MD, 15 mL at 01/24/2017 1435 .  nicardipine (CARDENE) 44m in 0.86% saline 2021mIV infusion (0.1 mg/ml), 3-15 mg/hr, Intravenous, Continuous, XuRosalin HawkingMD, Last Rate: 30 mL/hr at 01/04/2017 1713, 3 mg/hr at 01/12/2017 1713 .  NiMODipine (NYMALIZE) 60 MG/20ML oral solution 60 mg, 60 mg, Per Tube, Q4H, BaErick ColaceNP, 60 mg at 01/23/2017 1252 .  ondansetron (ZOFRAN) injection 4 mg, 4 mg, Intravenous, Q6H PRN, HoCorey HaroldNP .  pantoprazole (PROTONIX) injection 40 mg, 40 mg, Intravenous, QHS, HoCorey HaroldNP, 40 mg at 01/11/2017 2250 .  [START ON 01/04/2017] pneumococcal 23 valent vaccine (PNU-IMMUNE) injection 0.5 mL, 0.5 mL, Intramuscular, Tomorrow-1000, de Dios, JoFort Gaines, MD .  propofol (DIPRIVAN) 1000 MG/100ML infusion, 0-50 mcg/kg/min, Intravenous, Continuous, HoCorey HaroldNP, Stopped at 01/01/2017  1345  Description: This is a routine EEG performed using standard international 10-20 electrode placement. A total of 18 channels are recorded, including one for the EKG.  The patient is intubated and unresponsive in the ICU during this recording. According to the technologist's notes, she was on propofol but this was discontinued 40 minutes prior to this recording.  Activating Maneuvers: None  Findings:  The EKG channel demonstrates a regular rhythm with a rate of 60 beats per minute.   The background is mildly disorganized. Background is variable. She has periods where it consists largely of alpha frequencies averaging 8-10 Hz with some intermixed theta. This alternates with periods of generalized polymorphic delta with a bifrontal predominance, averaging 3-4 hertz. This background shows poor reactivity.   There appears to be a greater degree of slowing within the right frontal region. Also noted are possible phase reversals around the F4 electrode. No overt epileptiform abnormalities are appreciated. No seizures are recorded on this study.   Impression:  This is an abnormal EEG due to moderate diffuse generalized slowing with poor reactivity and disorganization of the background. No seizures. There is a greater degree of slowing in the right frontal region suggesting more localized cerebral dysfunction in that area.  Clinical correlation: This study is consistent with moderate global cerebral dysfunction with possibly greater dysfunction in the right frontal region. No evidence of seizures. Clinical correlation is recommended.   TiMelba CoonMD [facility]

## 2017-01-04 ENCOUNTER — Encounter (HOSPITAL_COMMUNITY): Payer: Self-pay | Admitting: Radiology

## 2017-01-04 ENCOUNTER — Inpatient Hospital Stay (HOSPITAL_COMMUNITY): Payer: Medicare HMO

## 2017-01-04 DIAGNOSIS — I671 Cerebral aneurysm, nonruptured: Secondary | ICD-10-CM

## 2017-01-04 DIAGNOSIS — I609 Nontraumatic subarachnoid hemorrhage, unspecified: Secondary | ICD-10-CM

## 2017-01-04 LAB — ECHOCARDIOGRAM COMPLETE
AV Area VTI index: 1.01 cm2/m2
AV Area VTI: 1.83 cm2
AV Area mean vel: 1.9 cm2
AV Mean grad: 12 mmHg
AV area mean vel ind: 1 cm2/m2
AV pk vel: 252 cm/s
AV vel: 1.91
AVA: 1.91 cm2
AVCELMEANRAT: 0.84
AVLVOTPG: 16 mmHg
AVPG: 25 mmHg
Ao pk vel: 0.81 m/s
Area-P 1/2: 3.24 cm2
CHL CUP AV PEAK INDEX: 0.97
CHL CUP MV DEC (S): 232
DOP CAL AO MEAN VELOCITY: 164 cm/s
E decel time: 232 msec
EERAT: 12.07
FS: 55 % — AB (ref 28–44)
HEIGHTINCHES: 63 in
IVS/LV PW RATIO, ED: 1.3
LA diam end sys: 32 mm
LA diam index: 1.7 cm/m2
LA vol A4C: 32 ml
LA vol index: 21.8 mL/m2
LASIZE: 32 mm
LAVOL: 41.2 mL
LV E/e' medial: 12.07
LV TDI E'LATERAL: 9.03
LV TDI E'MEDIAL: 7.83
LV e' LATERAL: 9.03 cm/s
LVEEAVG: 12.07
LVOT VTI: 41.4 cm
LVOT area: 2.27 cm2
LVOT peak VTI: 0.84 cm
LVOT peak vel: 203 cm/s
LVOTD: 17 mm
LVOTSV: 94 mL
MV Peak grad: 5 mmHg
MV pk A vel: 145 m/s
MV pk E vel: 109 m/s
P 1/2 time: 68 ms
PW: 10 mm — AB (ref 0.6–1.1)
RV LATERAL S' VELOCITY: 22 cm/s
RV TAPSE: 21.3 mm
VTI: 49.1 cm
Valve area index: 1.01
WEIGHTICAEL: 2747.81 [oz_av]

## 2017-01-04 LAB — COMPREHENSIVE METABOLIC PANEL
ALBUMIN: 3.3 g/dL — AB (ref 3.5–5.0)
ALK PHOS: 94 U/L (ref 38–126)
ALT: 67 U/L — ABNORMAL HIGH (ref 14–54)
ANION GAP: 9 (ref 5–15)
AST: 39 U/L (ref 15–41)
BILIRUBIN TOTAL: 1.4 mg/dL — AB (ref 0.3–1.2)
BUN: 11 mg/dL (ref 6–20)
CO2: 17 mmol/L — AB (ref 22–32)
Calcium: 7.5 mg/dL — ABNORMAL LOW (ref 8.9–10.3)
Chloride: 118 mmol/L — ABNORMAL HIGH (ref 101–111)
Creatinine, Ser: 0.85 mg/dL (ref 0.44–1.00)
GFR calc Af Amer: >60 mL/min (ref >60)
GFR calc non Af Amer: >60 mL/min (ref >60)
GLUCOSE: 230 mg/dL — AB (ref 65–99)
Potassium: 3 mmol/L — ABNORMAL LOW (ref 3.5–5.1)
SODIUM: 144 mmol/L (ref 135–145)
Total Protein: 5.7 g/dL — ABNORMAL LOW (ref 6.5–8.1)

## 2017-01-04 LAB — CBC
HCT: 33.6 % — ABNORMAL LOW (ref 36.0–46.0)
HEMOGLOBIN: 11.1 g/dL — AB (ref 12.0–15.0)
MCH: 28.5 pg (ref 26.0–34.0)
MCHC: 33 g/dL (ref 30.0–36.0)
MCV: 86.2 fL (ref 78.0–100.0)
Platelets: 207 10*3/uL (ref 150–400)
RBC: 3.9 MIL/uL (ref 3.87–5.11)
RDW: 13.4 % (ref 11.5–15.5)
WBC: 12 10*3/uL — ABNORMAL HIGH (ref 4.0–10.5)

## 2017-01-04 LAB — GLUCOSE, CAPILLARY
GLUCOSE-CAPILLARY: 376 mg/dL — AB (ref 65–99)
GLUCOSE-CAPILLARY: 361 mg/dL — AB (ref 65–99)
Glucose-Capillary: 270 mg/dL — ABNORMAL HIGH (ref 65–99)

## 2017-01-04 LAB — MAGNESIUM: Magnesium: 2.1 mg/dL (ref 1.7–2.4)

## 2017-01-04 LAB — MRSA CULTURE: Culture: NOT DETECTED

## 2017-01-04 LAB — PHOSPHORUS: Phosphorus: 2 mg/dL — ABNORMAL LOW (ref 2.5–4.6)

## 2017-01-04 MED ORDER — PERFLUTREN LIPID MICROSPHERE
1.0000 mL | INTRAVENOUS | Status: AC | PRN
  Administered 2017-01-04: 2 mL via INTRAVENOUS
  Filled 2017-01-04: qty 10

## 2017-01-04 MED ORDER — PERFLUTREN LIPID MICROSPHERE
INTRAVENOUS | Status: AC
  Administered 2017-01-04: 2 mL via INTRAVENOUS
  Filled 2017-01-04: qty 10

## 2017-01-04 MED ORDER — VITAL HIGH PROTEIN PO LIQD
1000.0000 mL | ORAL | Status: DC
  Administered 2017-01-04 – 2017-01-07 (×4): 1000 mL
  Administered 2017-01-07 (×3)
  Administered 2017-01-08: 1000 mL
  Administered 2017-01-08: 02:00:00
  Administered 2017-01-10: 1000 mL
  Filled 2017-01-04 (×5): qty 1000

## 2017-01-04 MED ORDER — POTASSIUM CHLORIDE 20 MEQ/15ML (10%) PO SOLN
40.0000 meq | Freq: Two times a day (BID) | ORAL | Status: AC
  Administered 2017-01-04 (×2): 40 meq via ORAL
  Filled 2017-01-04 (×2): qty 30

## 2017-01-04 MED ORDER — INSULIN ASPART 100 UNIT/ML ~~LOC~~ SOLN
0.0000 [IU] | SUBCUTANEOUS | Status: DC
  Administered 2017-01-04 – 2017-01-05 (×2): 7 [IU] via SUBCUTANEOUS
  Administered 2017-01-05 (×2): 5 [IU] via SUBCUTANEOUS
  Administered 2017-01-05: 9 [IU] via SUBCUTANEOUS
  Administered 2017-01-05: 5 [IU] via SUBCUTANEOUS
  Administered 2017-01-05: 7 [IU] via SUBCUTANEOUS
  Administered 2017-01-05: 5 [IU] via SUBCUTANEOUS
  Administered 2017-01-06: 1 [IU] via SUBCUTANEOUS
  Administered 2017-01-06: 2 [IU] via SUBCUTANEOUS
  Administered 2017-01-06 (×2): 5 [IU] via SUBCUTANEOUS
  Administered 2017-01-06: 3 [IU] via SUBCUTANEOUS
  Administered 2017-01-06 – 2017-01-07 (×2): 2 [IU] via SUBCUTANEOUS
  Administered 2017-01-07: 1 [IU] via SUBCUTANEOUS
  Administered 2017-01-07 (×2): 3 [IU] via SUBCUTANEOUS
  Administered 2017-01-07: 2 [IU] via SUBCUTANEOUS
  Administered 2017-01-07: 1 [IU] via SUBCUTANEOUS
  Administered 2017-01-08 (×2): 2 [IU] via SUBCUTANEOUS
  Administered 2017-01-08: 3 [IU] via SUBCUTANEOUS
  Administered 2017-01-08: 2 [IU] via SUBCUTANEOUS
  Administered 2017-01-09: 1 [IU] via SUBCUTANEOUS
  Administered 2017-01-09 (×3): 2 [IU] via SUBCUTANEOUS
  Administered 2017-01-09: 1 [IU] via SUBCUTANEOUS
  Administered 2017-01-09: 3 [IU] via SUBCUTANEOUS
  Administered 2017-01-10 (×4): 2 [IU] via SUBCUTANEOUS
  Administered 2017-01-10: 1 [IU] via SUBCUTANEOUS
  Administered 2017-01-10: 3 [IU] via SUBCUTANEOUS
  Administered 2017-01-11 (×2): 2 [IU] via SUBCUTANEOUS
  Administered 2017-01-11 (×2): 1 [IU] via SUBCUTANEOUS
  Administered 2017-01-11: 2 [IU] via SUBCUTANEOUS
  Administered 2017-01-12: 1 [IU] via SUBCUTANEOUS
  Administered 2017-01-12: 2 [IU] via SUBCUTANEOUS
  Administered 2017-01-12 – 2017-01-13 (×3): 1 [IU] via SUBCUTANEOUS

## 2017-01-04 MED ORDER — PRO-STAT SUGAR FREE PO LIQD
30.0000 mL | Freq: Three times a day (TID) | ORAL | Status: DC
  Administered 2017-01-04 – 2017-01-10 (×19): 30 mL
  Filled 2017-01-04 (×18): qty 30

## 2017-01-04 MED ORDER — ADULT MULTIVITAMIN LIQUID CH
15.0000 mL | Freq: Every day | ORAL | Status: DC
  Administered 2017-01-04 – 2017-01-12 (×9): 15 mL
  Filled 2017-01-04 (×10): qty 15

## 2017-01-04 NOTE — Plan of Care (Signed)
Problem: Nutrition: Goal: Adequate nutrition will be maintained Outcome: Completed/Met Date Met: 01/04/17 Started tube feeding today, 6/8

## 2017-01-04 NOTE — Progress Notes (Signed)
  Echocardiogram 2D Echocardiogram has been performed.  Alba Perillo G Brea Coleson 01/04/2017, 10:13 AM

## 2017-01-04 NOTE — Progress Notes (Signed)
Attempted to wean patient as reported more alert than yesterday. Placed patient on CP/PS 5/5, RR increased to 38 and increased WOB. RT increased PS to 10 then 14 without much improvement. Placed back on rest mode.

## 2017-01-04 NOTE — Progress Notes (Addendum)
Patient's ring that was on her right ring finger, gold with 4 black stones, with family's permission, was cut from patient's finger. Ring was given to patient's brother, Domingo Cockingduardo, with his wife present. Talita Recht C 6:30 PM  Domingo Cockingduardo, patient's brother, stated that patient was wearing a silver medical alert bracelet when she was admitted to our [facility] and wanted to know where it was.  I will ask night shift nurse who admitted the patient on 6/6 if it was given to another family member. Aizza Santiago C

## 2017-01-04 NOTE — Progress Notes (Signed)
Initial Nutrition Assessment  DOCUMENTATION CODES:   Obesity unspecified  INTERVENTION:   Vital High Protein @ 30 ml/hr (720 ml/day) via OG tube 30 ml Prostat TID MVI daily  Provides: 1020 kcal, 108 grams protein, and 601 ml free water.   Monitoring Phosphorus and magnesium  NUTRITION DIAGNOSIS:   Inadequate oral intake related to inability to eat as evidenced by NPO status.  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    Pt discussed during ICU rounds and with RN.  Patient is currently intubated on ventilator support  Propofol: 4.3 ml/hr provides: 113 kcal per day  Labs reviewed: K+ 3.0 (L) - on replacement PO4: 1.8 (L)  Spoke with daughter at bedside. She reports pt had been gaining weight and not eating healthy. She did live with daughter but had recently moved in with her brother (daughter's uncle) for the last two months.   Diet Order:  Diet NPO time specified  Skin:  Reviewed, no issues  Last BM:  unknown  Height:   Ht Readings from Last 1 Encounters:  12/31/2016 5\' 3"  (1.6 m)    Weight:   Wt Readings from Last 1 Encounters:  01/04/17 171 lb 11.8 oz (77.9 kg)    Ideal Body Weight:  52.2 kg  BMI:  Body mass index is 30.42 kg/m.  Estimated Nutritional Needs:   Kcal:  1000-1200  Protein:  >/= 105 grams  Fluid:  > 1.5 L/day  EDUCATION NEEDS:   No education needs identified at this time  Kendell BaneHeather Vinita Prentiss RD, LDN, CNSC [phone] Pager [phone] After Hours Pager

## 2017-01-04 NOTE — Progress Notes (Signed)
PULMONARY / CRITICAL CARE MEDICINE   Name: Sherri Hale MRN: [id number] DOB: 1950-12-28    ADMISSION DATE:  01/12/2017 CONSULTATION DATE:  6/6  REFERRING MD:  Dr. Verdie Mosher EDP  CHIEF COMPLAINT:  SAH  HISTORY OF PRESENT ILLNESS: Patient is encephalopathic and/or intubated. Therefore history has been obtained from chart review.   66 year old female with past medical history of hypertension who presented to [facility] emergency department 6/6 of the EMS after collapsing at [facility]. While at the [facility] she was complaining of severe headache and then suddenly became unresponsive. She didn't mature to seizure activity and subsequently vomited. She did briefly have some return of consciousness, however, this was short-lived and she was unresponsive on arrival to the emergency department. She was intubated for airway protection. She was profoundly hypertensive with systolic blood pressures in the 230s. In the emergency department she was started on nicardipine infusion and loaded with IV Keppra. She was taken for a CT scan of the head which showed extensive subarachnoid hemorrhage. She was evaluated by neurosurgery in the emergency department who will reevaluate her once more stable. Likely in the morning. PCCM has been asked to admit to ICU.   SUBJECTIVE:  Events overnight:  Withdraws to pain x 4 extremities  T Max 99.3  VITAL SIGNS: Temp:  [98.6 F (37 C)-99.3 F (37.4 C)] 99.3 F (37.4 C) (06/08 1100) Pulse Rate:  [59-91] 64 (06/08 1144) Resp:  [17-31] 20 (06/08 1144) BP: (101-159)/(50-88) 145/53 (06/08 1144) SpO2:  [90 %-100 %] 96 % (06/08 1144) Arterial Line BP: (121-180)/(48-67) 153/56 (06/08 1100) FiO2 (%):  [40 %] 40 % (06/08 1144) Weight:  [171 lb 11.8 oz (77.9 kg)] 171 lb 11.8 oz (77.9 kg) (06/08 0457)  HEMODYNAMICS:    VENTILATOR SETTINGS: Vent Mode: PRVC FiO2 (%):  [40 %] 40 % Set Rate:  [20 bmp] 20 bmp Vt Set:  [490 mL] 490 mL PEEP:  [5 cmH20] 5  cmH20 Plateau Pressure:  [12 cmH20-18 cmH20] 16 cmH20  INTAKE / OUTPUT:  Intake/Output Summary (Last 24 hours) at 01/04/17 1214 Last data filed at 01/04/17 1200  Gross per 24 hour  Intake           5379.3 ml  Output             1310 ml  Net           4069.3 ml     PHYSICAL EXAMINATION: General appearance:  Sedated and intubated female, with draws to pain Eyes: anicteric sclerae, moist conjunctivae; PERRL, , downward deviation Mouth:  MM pink and moist, no lesions or ulcers noted, Oral ETT, OG tube Neck: Trachea midline; neck supple, no JVD Lungs/chest: Clear with scant rhonchi noted, Full vent support,  and no intercostal retractions CV: RRR, S1, S2, No RMG  Abdomen: Soft, non-tender; non-distended no masses or HSM, BS+ Extremities: Trace  peripheral edema or extremity lymphadenopathy Skin: Intact, Normal temperature, turgor and texture; no rash, ulcers or subcutaneous nodules, no tattoos or piercings Neuro/ Psych: responds only to pain.Withdraws x 4 extremities  LABS:  BMET  Recent Labs Lab 12/30/2016 1810 01/19/2017 1824 01/24/2017 0248 01/04/17 0556  NA 139 143 140 144  K 3.3* 3.4* 3.9 3.0*  CL 110 108 106 118*  CO2 22  --  20* 17*  BUN 20 23* 15 11  CREATININE 0.83 0.80 0.87 0.85  GLUCOSE 165* 162* 195* 230*    Electrolytes  Recent Labs Lab 01/14/2017 1810 01/02/2017 0248 01/04/17 0556  CALCIUM  8.1* 9.0 7.5*  MG  --  1.6*  --   PHOS  --  1.8*  --     CBC  Recent Labs Lab 01-10-2017 1810 January 10, 2017 1824 01/24/2017 0248 01/04/17 0556  WBC 12.9*  --  12.5* 12.0*  HGB 11.9* 11.2* 12.6 11.1*  HCT 36.0 33.0* 37.4 33.6*  PLT 236  --  211 207    Coag's  Recent Labs Lab Jan 10, 2017 1810  APTT 34  INR 0.97    Sepsis Markers No results for input(s): LATICACIDVEN, PROCALCITON, O2SATVEN in the last 168 hours.  ABG  Recent Labs Lab Jan 10, 2017 1931 01/09/2017 0350  PHART 7.471* 7.441  PCO2ART 34.7 32.3  PO2ART 357.0* 137*    Liver Enzymes  Recent  Labs Lab 01-10-17 1810 01/04/17 0556  AST 67* 39  ALT 64* 67*  ALKPHOS 107 94  BILITOT 0.8 1.4*  ALBUMIN 3.7 3.3*    Cardiac Enzymes No results for input(s): TROPONINI, PROBNP in the last 168 hours.  Glucose No results for input(s): GLUCAP in the last 168 hours.  Imaging Ir Transcath/emboliz  Result Date: 12/28/2016 CLINICAL DATA:  The patient is a 66 year old woman presenting to the hospital with loss of consciousness after sudden onset of severe headache. CT scan demonstrated diffuse subarachnoid hemorrhage. She therefore presents for further workup with diagnostic cerebral angiogram and possible aneurysm coiling. OPERATOR: Dr. Lisbeth Renshaw EXAM: DIAGNOSTIC CEREBRAL ANGIOGRAM COIL EMBOLIZATION OF ANTERIOR COMMUNICATING ARTERY ANEURYSM PROCEDURE: The technical aspects of the procedure as well as its potential risks and benefits were reviewed with the patient. These risks included but were not limited to bleeding, infection, allergic reaction, damage to organs/vital structures, stroke, inability to repair vessel/place stent, delayed aneurysm rupture, and catastrophic outcomes of heart attack, coma, and death. With the understanding of these risks, informed consent was obtained and witnessed. The patient was placed in the supine position on the angiography table and the skin of the right groin was prepped in the usual sterile fashion. The procedure was performed under general anesthesia provided by the anesthesiology department. An 8 French sheath was introduced in the right femoral artery using Seldinger technique. Heparin: 0 units Fluoroscopy Time:  See IR records Contrast:  See IR records CATHETERS AND WIRES: 5-French JB-1 glidecatheter 180 cm 0.035" glidewire 6-French NeuronMax guide sheath 6-French Berenstein Select catheter 0.058" CatV guidecatheter 150 cm Marksman microcatheter Synchro 2 standard microwire Excelsior XT-17 straight microcatheter COILS USED (MRI compatible): Target 360  3x6 Nano Target 360 2.5 x 4 Nano x4 VESSELS CATHETERIZED: Right internal carotid Left internal carotid Left vertebral artery Right anterior cerebral artery, A2 segment VESSELS STUDIED: Right internal carotid, head Left internal carotid, head Left vertebral artery, head Right internal carotid artery (pre embolization ) Right internal carotid artery (during embolization ) Right internal carotid artery (immediate post embolization ) Right internal carotid artery final control PROCEDURAL DESCRIPTION: The JB 1 catheter was introduced over the Glidewire, and the right internal carotid, left internal carotid, and left vertebral artery were sequentially catheterized with cerebral angiograms taken. After review of the images, the catheter was removed without incident. Under real-time fluoroscopy, the select catheter within the NeuronMax sheath was introduced and advanced into the right internal carotid artery over an 0.035" guidewire. The Shuttle sheath was then advanced into the proximal right internal carotid artery. The Select catheter was removed, and repeat roadmap was obtained. A Marksman microcatheter was then introduced and under roadmap guidance was advanced over a Synchro 2 standard microwire into the distal M1 segment on  the right. The CatV catheter was then tracked over the Marksman to its final position in the horizontal cavernous internal carotid. The Marksman was then removed and the coiling catheter introduced over the microwire. The catheter was then navigated into the aneurysm lumen over the microwire. The wire was then removed. The above coils were then sequentially deployed with progressive exclusion of the aneurysm. Angiograms were intermittently taken after coil deployment. Cerebral angiography was performed immediately post embolization. The coiling catheter was then removed and the CatV withdrawn into the cervical internal carotid artery. Final control angiography was then performed from the Navien  guide catheter. The guide catheter and sheath were then synchronously removed without incident. The 8-Fr sheath was maintained on continuous flush and sutured in place. INTERPRETATION: Right femoral artery: The right femoral artery is unremarkable, arterial sheath in adequate position. Right internal carotid, head Injection reveals a widely patent right internal carotid artery, which has normal bifurcation into anterior and middle cerebral arteries. The distal branches of the ACA and MCA are unremarkable. The anterior communicating artery complex is identified, with a anteriorly projecting aneurysm seen. This aneurysm measures approximately 4.0 x 3.0 mm. It has relatively narrow neck arising from the anterior communicating artery itself. Capillary phase does not demonstrate any perfusion deficits. Venous phase is unremarkable. Left internal carotid Injection reveals a widely patent left internal carotid artery with normal bifurcation into anterior and middle cerebral arteries. 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 Distal branches of the ACA and MCA are unremarkable. No other aneurysms, AVM, or fistulas are seen. Capillary phase does not demonstrate any perfusion deficits. Venous phase is unremarkable. Left vertebral Left vertebral artery is widely patent, as is the basilar artery. The basilar bifurcation is normal. Posterior cerebral arteries are unremarkable. There is contrast reflux down the contralateral vertebral artery. No aneurysms are identified. There are no perfusion deficits. Venous phase is unremarkable. Right internal carotid artery, head (pre-embolization): Injection reveals the presence of a widely patent ICA that leads to a patent ACA and MCA. 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 The visualized portions of capillary and venous phases are unremarkable. Right internal carotid artery, head  (during embolization): Angiograms taken during coil embolization demonstrate good position of the coil mass, without evidence of coil prolapse into the anterior cerebral arteries or anterior communicating artery. No filling defects are seen. Right internal carotid artery, head (immediate post-embolization): Injection reveals the presence of a widely patent ICA that leads to a patent ACA and MCA. Coil mass within the aneurysm is stable, without coil prolapse or filling defect to suggest thrombus. There does not appear to be any significant aneurysm filling seen. Right internal carotid artery, head (final control): Injection reveals the presence of a widely patent ICA that leads to a patent ACA and MCA. No thrombus is visualized. Coil mass is seen within the aneurysm without any aneurysm filling seen. Anterior communicating artery remains patent, with filling of the contralateral anterior circulation seen. The parenchymal and venous phases are unremarkable. DISPOSITION: After completion of the procedure, the femoral sheath was replaced with a 7 [place] ExoSeal closure device which was deployed successfully. The procedure was well tolerated and no early complications were observed. The patient was transferred to the neuro intensive care unit for further care. IMPRESSION: 1. 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 No aneurysm filling is seen post-embolization. 2. No  other aneurysms, AVM, or high-flow fistulas are seen. Electronically Signed   By: Lisbeth RenshawNeelesh  Nundkumar   On: 01/14/2017 17:10   Ir Angiogram Follow Up Study  Result Date: 01/12/2017 CLINICAL DATA:  The patient is a 66 year old woman presenting to the hospital with loss of consciousness after sudden onset of severe headache. CT scan demonstrated diffuse subarachnoid hemorrhage. She therefore presents for further workup with diagnostic cerebral  angiogram and possible aneurysm coiling. OPERATOR: Dr. Lisbeth RenshawNeelesh Nundkumar EXAM: DIAGNOSTIC CEREBRAL ANGIOGRAM COIL EMBOLIZATION OF ANTERIOR COMMUNICATING ARTERY ANEURYSM PROCEDURE: The technical aspects of the procedure as well as its potential risks and benefits were reviewed with the patient. These risks included but were not limited to bleeding, infection, allergic reaction, damage to organs/vital structures, stroke, inability to repair vessel/place stent, delayed aneurysm rupture, and catastrophic outcomes of heart attack, coma, and death. With the understanding of these risks, informed consent was obtained and witnessed. The patient was placed in the supine position on the angiography table and the skin of the right groin was prepped in the usual sterile fashion. The procedure was performed under general anesthesia provided by the anesthesiology department. An 8 French sheath was introduced in the right femoral artery using Seldinger technique. Heparin: 0 units Fluoroscopy Time:  See IR records Contrast:  See IR records CATHETERS AND WIRES: 5-French JB-1 glidecatheter 180 cm 0.035" glidewire 6-French NeuronMax guide sheath 6-French Berenstein Select catheter 0.058" CatV guidecatheter 150 cm Marksman microcatheter Synchro 2 standard microwire Excelsior XT-17 straight microcatheter COILS USED (MRI compatible): Target 360 3x6 Nano Target 360 2.5 x 4 Nano x4 VESSELS CATHETERIZED: Right internal carotid Left internal carotid Left vertebral artery Right anterior cerebral artery, A2 segment VESSELS STUDIED: Right internal carotid, head Left internal carotid, head Left vertebral artery, head Right internal carotid artery (pre embolization ) Right internal carotid artery (during embolization ) Right internal carotid artery (immediate post embolization ) Right internal carotid artery final control PROCEDURAL DESCRIPTION: The JB 1 catheter was introduced over the Glidewire, and the right internal carotid, left internal  carotid, and left vertebral artery were sequentially catheterized with cerebral angiograms taken. After review of the images, the catheter was removed without incident. Under real-time fluoroscopy, the select catheter within the NeuronMax sheath was introduced and advanced into the right internal carotid artery over an 0.035" guidewire. The Shuttle sheath was then advanced into the proximal right internal carotid artery. The Select catheter was removed, and repeat roadmap was obtained. A Marksman microcatheter was then introduced and under roadmap guidance was advanced over a Synchro 2 standard microwire into the distal M1 segment on the right. The CatV catheter was then tracked over the Marksman to its final position in the horizontal cavernous internal carotid. The Marksman was then removed and the coiling catheter introduced over the microwire. The catheter was then navigated into the aneurysm lumen over the microwire. The wire was then removed. The above coils were then sequentially deployed with progressive exclusion of the aneurysm. Angiograms were intermittently taken after coil deployment. Cerebral angiography was performed immediately post embolization. The coiling catheter was then removed and the CatV withdrawn into the cervical internal carotid artery. Final control angiography was then performed from the Navien guide catheter. The guide catheter and sheath were then synchronously removed without incident. The 8-Fr sheath was maintained on continuous flush and sutured in place. INTERPRETATION: Right femoral artery: The right femoral artery is unremarkable, arterial sheath in adequate position. Right internal carotid, head Injection reveals a widely patent right internal carotid artery, which  has normal bifurcation into anterior and middle cerebral arteries. The distal branches of the ACA and MCA are unremarkable. The anterior communicating artery complex is identified, with a anteriorly projecting  aneurysm seen. This aneurysm measures approximately 4.0 x 3.0 mm. It has relatively narrow neck arising from the anterior communicating artery itself. Capillary phase does not demonstrate any perfusion deficits. Venous phase is unremarkable. Left internal carotid Injection reveals a widely patent left internal carotid artery with normal bifurcation into anterior and middle cerebral arteries. 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 Distal branches of the ACA and MCA are unremarkable. No other aneurysms, AVM, or fistulas are seen. Capillary phase does not demonstrate any perfusion deficits. Venous phase is unremarkable. Left vertebral Left vertebral artery is widely patent, as is the basilar artery. The basilar bifurcation is normal. Posterior cerebral arteries are unremarkable. There is contrast reflux down the contralateral vertebral artery. No aneurysms are identified. There are no perfusion deficits. Venous phase is unremarkable. Right internal carotid artery, head (pre-embolization): Injection reveals the presence of a widely patent ICA that leads to a patent ACA and MCA. 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 The visualized portions of capillary and venous phases are unremarkable. Right internal carotid artery, head (during embolization): Angiograms taken during coil embolization demonstrate good position of the coil mass, without evidence of coil prolapse into the anterior cerebral arteries or anterior communicating artery. No filling defects are seen. Right internal carotid artery, head (immediate post-embolization): Injection reveals the presence of a widely patent ICA that leads to a patent ACA and MCA. Coil mass within the aneurysm is stable, without coil prolapse or filling defect to suggest thrombus. There does not appear to be any significant aneurysm filling seen. Right internal carotid artery,  head (final control): Injection reveals the presence of a widely patent ICA that leads to a patent ACA and MCA. No thrombus is visualized. Coil mass is seen within the aneurysm without any aneurysm filling seen. Anterior communicating artery remains patent, with filling of the contralateral anterior circulation seen. The parenchymal and venous phases are unremarkable. DISPOSITION: After completion of the procedure, the femoral sheath was replaced with a 7 [place] ExoSeal closure device which was deployed successfully. The procedure was well tolerated and no early complications were observed. The patient was transferred to the neuro intensive care unit for further care. IMPRESSION: 1. 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 No aneurysm filling is seen post-embolization. 2. No other aneurysms, AVM, or high-flow fistulas are seen. Electronically Signed   By: Lisbeth Renshaw   On: 01/25/2017 17:10   Ir Angiogram Follow Up Study  Result Date: 01/12/2017 CLINICAL DATA:  The patient is a 66 year old woman presenting to the hospital with loss of consciousness after sudden onset of severe headache. CT scan demonstrated diffuse subarachnoid hemorrhage. She therefore presents for further workup with diagnostic cerebral angiogram and possible aneurysm coiling. OPERATOR: Dr. Lisbeth Renshaw EXAM: DIAGNOSTIC CEREBRAL ANGIOGRAM COIL EMBOLIZATION OF ANTERIOR COMMUNICATING ARTERY ANEURYSM PROCEDURE: The technical aspects of the procedure as well as its potential risks and benefits were reviewed with the patient. These risks included but were not limited to bleeding, infection, allergic reaction, damage to organs/vital structures, stroke, inability to repair vessel/place stent, delayed aneurysm rupture, and catastrophic outcomes of heart attack, coma, and death. With the understanding of these risks, informed  consent was obtained and witnessed.  The patient was placed in the supine position on the angiography table and the skin of the right groin was prepped in the usual sterile fashion. The procedure was performed under general anesthesia provided by the anesthesiology department. An 8 French sheath was introduced in the right femoral artery using Seldinger technique. Heparin: 0 units Fluoroscopy Time:  See IR records Contrast:  See IR records CATHETERS AND WIRES: 5-French JB-1 glidecatheter 180 cm 0.035" glidewire 6-French NeuronMax guide sheath 6-French Berenstein Select catheter 0.058" CatV guidecatheter 150 cm Marksman microcatheter Synchro 2 standard microwire Excelsior XT-17 straight microcatheter COILS USED (MRI compatible): Target 360 3x6 Nano Target 360 2.5 x 4 Nano x4 VESSELS CATHETERIZED: Right internal carotid Left internal carotid Left vertebral artery Right anterior cerebral artery, A2 segment VESSELS STUDIED: Right internal carotid, head Left internal carotid, head Left vertebral artery, head Right internal carotid artery (pre embolization ) Right internal carotid artery (during embolization ) Right internal carotid artery (immediate post embolization ) Right internal carotid artery final control PROCEDURAL DESCRIPTION: The JB 1 catheter was introduced over the Glidewire, and the right internal carotid, left internal carotid, and left vertebral artery were sequentially catheterized with cerebral angiograms taken. After review of the images, the catheter was removed without incident. Under real-time fluoroscopy, the select catheter within the NeuronMax sheath was introduced and advanced into the right internal carotid artery over an 0.035" guidewire. The Shuttle sheath was then advanced into the proximal right internal carotid artery. The Select catheter was removed, and repeat roadmap was obtained. A Marksman microcatheter was then introduced and under roadmap guidance was advanced over a Synchro 2  standard microwire into the distal M1 segment on the right. The CatV catheter was then tracked over the Marksman to its final position in the horizontal cavernous internal carotid. The Marksman was then removed and the coiling catheter introduced over the microwire. The catheter was then navigated into the aneurysm lumen over the microwire. The wire was then removed. The above coils were then sequentially deployed with progressive exclusion of the aneurysm. Angiograms were intermittently taken after coil deployment. Cerebral angiography was performed immediately post embolization. The coiling catheter was then removed and the CatV withdrawn into the cervical internal carotid artery. Final control angiography was then performed from the Navien guide catheter. The guide catheter and sheath were then synchronously removed without incident. The 8-Fr sheath was maintained on continuous flush and sutured in place. INTERPRETATION: Right femoral artery: The right femoral artery is unremarkable, arterial sheath in adequate position. Right internal carotid, head Injection reveals a widely patent right internal carotid artery, which has normal bifurcation into anterior and middle cerebral arteries. The distal branches of the ACA and MCA are unremarkable. The anterior communicating artery complex is identified, with a anteriorly projecting aneurysm seen. This aneurysm measures approximately 4.0 x 3.0 mm. It has relatively narrow neck arising from the anterior communicating artery itself. Capillary phase does not demonstrate any perfusion deficits. Venous phase is unremarkable. Left internal carotid Injection reveals a widely patent left internal carotid artery with normal bifurcation into anterior and middle cerebral arteries. 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 Distal branches of the ACA and MCA are unremarkable. No other aneurysms, AVM, or  fistulas are seen. Capillary phase does not demonstrate any perfusion deficits. Venous phase is unremarkable. Left vertebral Left vertebral artery is widely patent, as is the basilar artery. The basilar bifurcation is normal. Posterior cerebral arteries are unremarkable. There is  contrast reflux down the contralateral vertebral artery. No aneurysms are identified. There are no perfusion deficits. Venous phase is unremarkable. Right internal carotid artery, head (pre-embolization): Injection reveals the presence of a widely patent ICA that leads to a patent ACA and MCA. 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 The visualized portions of capillary and venous phases are unremarkable. Right internal carotid artery, head (during embolization): Angiograms taken during coil embolization demonstrate good position of the coil mass, without evidence of coil prolapse into the anterior cerebral arteries or anterior communicating artery. No filling defects are seen. Right internal carotid artery, head (immediate post-embolization): Injection reveals the presence of a widely patent ICA that leads to a patent ACA and MCA. Coil mass within the aneurysm is stable, without coil prolapse or filling defect to suggest thrombus. There does not appear to be any significant aneurysm filling seen. Right internal carotid artery, head (final control): Injection reveals the presence of a widely patent ICA that leads to a patent ACA and MCA. No thrombus is visualized. Coil mass is seen within the aneurysm without any aneurysm filling seen. Anterior communicating artery remains patent, with filling of the contralateral anterior circulation seen. The parenchymal and venous phases are unremarkable. DISPOSITION: After completion of the procedure, the femoral sheath was replaced with a 7 [place] ExoSeal closure device which was deployed successfully. The procedure was well tolerated and no early complications were observed. The  patient was transferred to the neuro intensive care unit for further care. IMPRESSION: 1. 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 No aneurysm filling is seen post-embolization. 2. No other aneurysms, AVM, or high-flow fistulas are seen. Electronically Signed   By: Lisbeth Renshaw   On: 01/15/2017 17:10   Ir Angiogram Follow Up Study  Result Date: 01/09/2017 CLINICAL DATA:  The patient is a 66 year old woman presenting to the hospital with loss of consciousness after sudden onset of severe headache. CT scan demonstrated diffuse subarachnoid hemorrhage. She therefore presents for further workup with diagnostic cerebral angiogram and possible aneurysm coiling. OPERATOR: Dr. Lisbeth Renshaw EXAM: DIAGNOSTIC CEREBRAL ANGIOGRAM COIL EMBOLIZATION OF ANTERIOR COMMUNICATING ARTERY ANEURYSM PROCEDURE: The technical aspects of the procedure as well as its potential risks and benefits were reviewed with the patient. These risks included but were not limited to bleeding, infection, allergic reaction, damage to organs/vital structures, stroke, inability to repair vessel/place stent, delayed aneurysm rupture, and catastrophic outcomes of heart attack, coma, and death. With the understanding of these risks, informed consent was obtained and witnessed. The patient was placed in the supine position on the angiography table and the skin of the right groin was prepped in the usual sterile fashion. The procedure was performed under general anesthesia provided by the anesthesiology department. An 8 French sheath was introduced in the right femoral artery using Seldinger technique. Heparin: 0 units Fluoroscopy Time:  See IR records Contrast:  See IR records CATHETERS AND WIRES: 5-French JB-1 glidecatheter 180 cm 0.035" glidewire 6-French NeuronMax guide sheath 6-French Berenstein Select catheter 0.058" CatV  guidecatheter 150 cm Marksman microcatheter Synchro 2 standard microwire Excelsior XT-17 straight microcatheter COILS USED (MRI compatible): Target 360 3x6 Nano Target 360 2.5 x 4 Nano x4 VESSELS CATHETERIZED: Right internal carotid Left internal carotid Left vertebral artery Right anterior cerebral artery, A2 segment VESSELS STUDIED: Right internal carotid, head Left internal carotid, head Left vertebral artery, head Right internal carotid artery (pre embolization ) Right internal carotid artery (during  embolization ) Right internal carotid artery (immediate post embolization ) Right internal carotid artery final control PROCEDURAL DESCRIPTION: The JB 1 catheter was introduced over the Glidewire, and the right internal carotid, left internal carotid, and left vertebral artery were sequentially catheterized with cerebral angiograms taken. After review of the images, the catheter was removed without incident. Under real-time fluoroscopy, the select catheter within the NeuronMax sheath was introduced and advanced into the right internal carotid artery over an 0.035" guidewire. The Shuttle sheath was then advanced into the proximal right internal carotid artery. The Select catheter was removed, and repeat roadmap was obtained. A Marksman microcatheter was then introduced and under roadmap guidance was advanced over a Synchro 2 standard microwire into the distal M1 segment on the right. The CatV catheter was then tracked over the Marksman to its final position in the horizontal cavernous internal carotid. The Marksman was then removed and the coiling catheter introduced over the microwire. The catheter was then navigated into the aneurysm lumen over the microwire. The wire was then removed. The above coils were then sequentially deployed with progressive exclusion of the aneurysm. Angiograms were intermittently taken after coil deployment. Cerebral angiography was performed immediately post embolization. The coiling  catheter was then removed and the CatV withdrawn into the cervical internal carotid artery. Final control angiography was then performed from the Navien guide catheter. The guide catheter and sheath were then synchronously removed without incident. The 8-Fr sheath was maintained on continuous flush and sutured in place. INTERPRETATION: Right femoral artery: The right femoral artery is unremarkable, arterial sheath in adequate position. Right internal carotid, head Injection reveals a widely patent right internal carotid artery, which has normal bifurcation into anterior and middle cerebral arteries. The distal branches of the ACA and MCA are unremarkable. The anterior communicating artery complex is identified, with a anteriorly projecting aneurysm seen. This aneurysm measures approximately 4.0 x 3.0 mm. It has relatively narrow neck arising from the anterior communicating artery itself. Capillary phase does not demonstrate any perfusion deficits. Venous phase is unremarkable. Left internal carotid Injection reveals a widely patent left internal carotid artery with normal bifurcation into anterior and middle cerebral arteries. 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 Distal branches of the ACA and MCA are unremarkable. No other aneurysms, AVM, or fistulas are seen. Capillary phase does not demonstrate any perfusion deficits. Venous phase is unremarkable. Left vertebral Left vertebral artery is widely patent, as is the basilar artery. The basilar bifurcation is normal. Posterior cerebral arteries are unremarkable. There is contrast reflux down the contralateral vertebral artery. No aneurysms are identified. There are no perfusion deficits. Venous phase is unremarkable. Right internal carotid artery, head (pre-embolization): Injection reveals the presence of a widely patent ICA that leads to a patent ACA and MCA. The anterior  communicating artery aneurysm is again visualized. The visualized portions of capillary and venous phases are unremarkable. Right internal carotid artery, head (during embolization): Angiograms taken during coil embolization demonstrate good position of the coil mass, without evidence of coil prolapse into the anterior cerebral arteries or anterior communicating artery. No filling defects are seen. Right internal carotid artery, head (immediate post-embolization): Injection reveals the presence of a widely patent ICA that leads to a patent ACA and MCA. Coil mass within the aneurysm is stable, without coil prolapse or filling defect to suggest thrombus. There does not appear to be any significant aneurysm filling seen. Right internal carotid artery, head (final control):  Injection reveals the presence of a widely patent ICA that leads to a patent ACA and MCA. No thrombus is visualized. Coil mass is seen within the aneurysm without any aneurysm filling seen. Anterior communicating artery remains patent, with filling of the contralateral anterior circulation seen. The parenchymal and venous phases are unremarkable. DISPOSITION: After completion of the procedure, the femoral sheath was replaced with a 7 [place] ExoSeal closure device which was deployed successfully. The procedure was well tolerated and no early complications were observed. The patient was transferred to the neuro intensive care unit for further care. IMPRESSION: 1. 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 No aneurysm filling is seen post-embolization. 2. No other aneurysms, AVM, or high-flow fistulas are seen. Electronically Signed   By: Lisbeth Renshaw   On: 01/16/2017 17:10   Ir Angiogram Follow Up Study  Result Date: 01/02/2017 CLINICAL DATA:  The patient is a 66 year old woman presenting to the hospital with loss of consciousness  after sudden onset of severe headache. CT scan demonstrated diffuse subarachnoid hemorrhage. She therefore presents for further workup with diagnostic cerebral angiogram and possible aneurysm coiling. OPERATOR: Dr. Lisbeth Renshaw EXAM: DIAGNOSTIC CEREBRAL ANGIOGRAM COIL EMBOLIZATION OF ANTERIOR COMMUNICATING ARTERY ANEURYSM PROCEDURE: The technical aspects of the procedure as well as its potential risks and benefits were reviewed with the patient. These risks included but were not limited to bleeding, infection, allergic reaction, damage to organs/vital structures, stroke, inability to repair vessel/place stent, delayed aneurysm rupture, and catastrophic outcomes of heart attack, coma, and death. With the understanding of these risks, informed consent was obtained and witnessed. The patient was placed in the supine position on the angiography table and the skin of the right groin was prepped in the usual sterile fashion. The procedure was performed under general anesthesia provided by the anesthesiology department. An 8 French sheath was introduced in the right femoral artery using Seldinger technique. Heparin: 0 units Fluoroscopy Time:  See IR records Contrast:  See IR records CATHETERS AND WIRES: 5-French JB-1 glidecatheter 180 cm 0.035" glidewire 6-French NeuronMax guide sheath 6-French Berenstein Select catheter 0.058" CatV guidecatheter 150 cm Marksman microcatheter Synchro 2 standard microwire Excelsior XT-17 straight microcatheter COILS USED (MRI compatible): Target 360 3x6 Nano Target 360 2.5 x 4 Nano x4 VESSELS CATHETERIZED: Right internal carotid Left internal carotid Left vertebral artery Right anterior cerebral artery, A2 segment VESSELS STUDIED: Right internal carotid, head Left internal carotid, head Left vertebral artery, head Right internal carotid artery (pre embolization ) Right internal carotid artery (during embolization ) Right internal carotid artery (immediate post embolization ) Right  internal carotid artery final control PROCEDURAL DESCRIPTION: The JB 1 catheter was introduced over the Glidewire, and the right internal carotid, left internal carotid, and left vertebral artery were sequentially catheterized with cerebral angiograms taken. After review of the images, the catheter was removed without incident. Under real-time fluoroscopy, the select catheter within the NeuronMax sheath was introduced and advanced into the right internal carotid artery over an 0.035" guidewire. The Shuttle sheath was then advanced into the proximal right internal carotid artery. The Select catheter was removed, and repeat roadmap was obtained. A Marksman microcatheter was then introduced and under roadmap guidance was advanced over a Synchro 2 standard microwire into the distal M1 segment on the right. The CatV catheter was then tracked over the Marksman to its final position in the horizontal cavernous internal carotid. The Marksman was then removed and the coiling  catheter introduced over the microwire. The catheter was then navigated into the aneurysm lumen over the microwire. The wire was then removed. The above coils were then sequentially deployed with progressive exclusion of the aneurysm. Angiograms were intermittently taken after coil deployment. Cerebral angiography was performed immediately post embolization. The coiling catheter was then removed and the CatV withdrawn into the cervical internal carotid artery. Final control angiography was then performed from the Navien guide catheter. The guide catheter and sheath were then synchronously removed without incident. The 8-Fr sheath was maintained on continuous flush and sutured in place. INTERPRETATION: Right femoral artery: The right femoral artery is unremarkable, arterial sheath in adequate position. Right internal carotid, head Injection reveals a widely patent right internal carotid artery, which has normal bifurcation into anterior and middle  cerebral arteries. The distal branches of the ACA and MCA are unremarkable. The anterior communicating artery complex is identified, with a anteriorly projecting aneurysm seen. This aneurysm measures approximately 4.0 x 3.0 mm. It has relatively narrow neck arising from the anterior communicating artery itself. Capillary phase does not demonstrate any perfusion deficits. Venous phase is unremarkable. Left internal carotid Injection reveals a widely patent left internal carotid artery with normal bifurcation into anterior and middle cerebral arteries. 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 Distal branches of the ACA and MCA are unremarkable. No other aneurysms, AVM, or fistulas are seen. Capillary phase does not demonstrate any perfusion deficits. Venous phase is unremarkable. Left vertebral Left vertebral artery is widely patent, as is the basilar artery. The basilar bifurcation is normal. Posterior cerebral arteries are unremarkable. There is contrast reflux down the contralateral vertebral artery. No aneurysms are identified. There are no perfusion deficits. Venous phase is unremarkable. Right internal carotid artery, head (pre-embolization): Injection reveals the presence of a widely patent ICA that leads to a patent ACA and MCA. 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 The visualized portions of capillary and venous phases are unremarkable. Right internal carotid artery, head (during embolization): Angiograms taken during coil embolization demonstrate good position of the coil mass, without evidence of coil prolapse into the anterior cerebral arteries or anterior communicating artery. No filling defects are seen. Right internal carotid artery, head (immediate post-embolization): Injection reveals the presence of a widely patent ICA that leads to a patent ACA and MCA. Coil mass within the aneurysm is  stable, without coil prolapse or filling defect to suggest thrombus. There does not appear to be any significant aneurysm filling seen. Right internal carotid artery, head (final control): Injection reveals the presence of a widely patent ICA that leads to a patent ACA and MCA. No thrombus is visualized. Coil mass is seen within the aneurysm without any aneurysm filling seen. Anterior communicating artery remains patent, with filling of the contralateral anterior circulation seen. The parenchymal and venous phases are unremarkable. DISPOSITION: After completion of the procedure, the femoral sheath was replaced with a 7 [place] ExoSeal closure device which was deployed successfully. The procedure was well tolerated and no early complications were observed. The patient was transferred to the neuro intensive care unit for further care. IMPRESSION: 1. 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 No aneurysm filling is seen post-embolization. 2. No other aneurysms, AVM, or high-flow fistulas are seen. Electronically Signed   By: Lisbeth Renshaw   On: Jan 17, 2017 17:10   Ir Angiogram Follow Up Study  Result Date: 01/16/2017 CLINICAL DATA:  The patient is a 66 year old woman presenting to the hospital with loss of consciousness after sudden onset of severe headache. CT scan demonstrated diffuse subarachnoid hemorrhage. She therefore presents for further workup with diagnostic cerebral angiogram and possible aneurysm coiling. OPERATOR: Dr. Lisbeth Renshaw EXAM: DIAGNOSTIC CEREBRAL ANGIOGRAM COIL EMBOLIZATION OF ANTERIOR COMMUNICATING ARTERY ANEURYSM PROCEDURE: The technical aspects of the procedure as well as its potential risks and benefits were reviewed with the patient. These risks included but were not limited to bleeding, infection, allergic reaction, damage to organs/vital structures, stroke,  inability to repair vessel/place stent, delayed aneurysm rupture, and catastrophic outcomes of heart attack, coma, and death. With the understanding of these risks, informed consent was obtained and witnessed. The patient was placed in the supine position on the angiography table and the skin of the right groin was prepped in the usual sterile fashion. The procedure was performed under general anesthesia provided by the anesthesiology department. An 8 French sheath was introduced in the right femoral artery using Seldinger technique. Heparin: 0 units Fluoroscopy Time:  See IR records Contrast:  See IR records CATHETERS AND WIRES: 5-French JB-1 glidecatheter 180 cm 0.035" glidewire 6-French NeuronMax guide sheath 6-French Berenstein Select catheter 0.058" CatV guidecatheter 150 cm Marksman microcatheter Synchro 2 standard microwire Excelsior XT-17 straight microcatheter COILS USED (MRI compatible): Target 360 3x6 Nano Target 360 2.5 x 4 Nano x4 VESSELS CATHETERIZED: Right internal carotid Left internal carotid Left vertebral artery Right anterior cerebral artery, A2 segment VESSELS STUDIED: Right internal carotid, head Left internal carotid, head Left vertebral artery, head Right internal carotid artery (pre embolization ) Right internal carotid artery (during embolization ) Right internal carotid artery (immediate post embolization ) Right internal carotid artery final control PROCEDURAL DESCRIPTION: The JB 1 catheter was introduced over the Glidewire, and the right internal carotid, left internal carotid, and left vertebral artery were sequentially catheterized with cerebral angiograms taken. After review of the images, the catheter was removed without incident. Under real-time fluoroscopy, the select catheter within the NeuronMax sheath was introduced and advanced into the right internal carotid artery over an 0.035" guidewire. The Shuttle sheath was then advanced into the proximal right internal carotid artery.  The Select catheter was removed, and repeat roadmap was obtained. A Marksman microcatheter was then introduced and under roadmap guidance was advanced over a Synchro 2 standard microwire into the distal M1 segment on the right. The CatV catheter was then tracked over the Marksman to its final position in the horizontal cavernous internal carotid. The Marksman was then removed and the coiling catheter introduced over the microwire. The catheter was then navigated into the aneurysm lumen over the microwire. The wire was then removed. The above coils were then sequentially deployed with progressive exclusion of the aneurysm. Angiograms were intermittently taken after coil deployment. Cerebral angiography was performed immediately post embolization. The coiling catheter was then removed and the CatV withdrawn into the cervical internal carotid artery. Final control angiography was then performed from the Navien guide catheter. The guide catheter and sheath were then synchronously removed without incident. The 8-Fr sheath was maintained on continuous flush and sutured in place. INTERPRETATION: Right femoral artery: The right femoral artery is unremarkable, arterial sheath in adequate position. Right internal carotid, head Injection reveals a widely patent right internal carotid artery, which has normal bifurcation into anterior and middle cerebral arteries. The distal branches of the ACA and MCA are unremarkable. The anterior communicating artery complex is identified, with a anteriorly  projecting aneurysm seen. This aneurysm measures approximately 4.0 x 3.0 mm. It has relatively narrow neck arising from the anterior communicating artery itself. Capillary phase does not demonstrate any perfusion deficits. Venous phase is unremarkable. Left internal carotid Injection reveals a widely patent left internal carotid artery with normal bifurcation into anterior and middle cerebral arteries. The anterior communicating artery  aneurysm is again visualized as described in the right internal carotid artery injection. Distal branches of the ACA and MCA are unremarkable. No other aneurysms, AVM, or fistulas are seen. Capillary phase does not demonstrate any perfusion deficits. Venous phase is unremarkable. Left vertebral Left vertebral artery is widely patent, as is the basilar artery. The basilar bifurcation is normal. Posterior cerebral arteries are unremarkable. There is contrast reflux down the contralateral vertebral artery. No aneurysms are identified. There are no perfusion deficits. Venous phase is unremarkable. Right internal carotid artery, head (pre-embolization): Injection reveals the presence of a widely patent ICA that leads to a patent ACA and MCA. 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 The visualized portions of capillary and venous phases are unremarkable. Right internal carotid artery, head (during embolization): Angiograms taken during coil embolization demonstrate good position of the coil mass, without evidence of coil prolapse into the anterior cerebral arteries or anterior communicating artery. No filling defects are seen. Right internal carotid artery, head (immediate post-embolization): Injection reveals the presence of a widely patent ICA that leads to a patent ACA and MCA. Coil mass within the aneurysm is stable, without coil prolapse or filling defect to suggest thrombus. There does not appear to be any significant aneurysm filling seen. Right internal carotid artery, head (final control): Injection reveals the presence of a widely patent ICA that leads to a patent ACA and MCA. No thrombus is visualized. Coil mass is seen within the aneurysm without any aneurysm filling seen. Anterior communicating artery remains patent, with filling of the contralateral anterior circulation seen. The parenchymal and venous phases are unremarkable. DISPOSITION: After completion of the procedure, the femoral  sheath was replaced with a 7 [place] ExoSeal closure device which was deployed successfully. The procedure was well tolerated and no early complications were observed. The patient was transferred to the neuro intensive care unit for further care. IMPRESSION: 1. 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 No aneurysm filling is seen post-embolization. 2. No other aneurysms, AVM, or high-flow fistulas are seen. Electronically Signed   By: Lisbeth Renshaw   On: 01-24-2017 17:10   Ir Angio Intra Extracran Sel Internal Carotid Bilat Mod Sed  Result Date: 2017/01/24 CLINICAL DATA:  The patient is a 66 year old woman presenting to the hospital with loss of consciousness after sudden onset of severe headache. CT scan demonstrated diffuse subarachnoid hemorrhage. She therefore presents for further workup with diagnostic cerebral angiogram and possible aneurysm coiling. OPERATOR: Dr. Lisbeth Renshaw EXAM: DIAGNOSTIC CEREBRAL ANGIOGRAM COIL EMBOLIZATION OF ANTERIOR COMMUNICATING ARTERY ANEURYSM PROCEDURE: The technical aspects of the procedure as well as its potential risks and benefits were reviewed with the patient. These risks included but were not limited to bleeding, infection, allergic reaction, damage to organs/vital structures, stroke, inability to repair vessel/place stent, delayed aneurysm rupture, and catastrophic outcomes of heart attack, coma, and death. With the understanding of these risks, informed consent was obtained and witnessed. The patient was placed in the supine position on the angiography table and the skin of the right groin was prepped in the usual sterile  fashion. The procedure was performed under general anesthesia provided by the anesthesiology department. An 8 French sheath was introduced in the right femoral artery using Seldinger technique. Heparin: 0 units Fluoroscopy Time:   See IR records Contrast:  See IR records CATHETERS AND WIRES: 5-French JB-1 glidecatheter 180 cm 0.035" glidewire 6-French NeuronMax guide sheath 6-French Berenstein Select catheter 0.058" CatV guidecatheter 150 cm Marksman microcatheter Synchro 2 standard microwire Excelsior XT-17 straight microcatheter COILS USED (MRI compatible): Target 360 3x6 Nano Target 360 2.5 x 4 Nano x4 VESSELS CATHETERIZED: Right internal carotid Left internal carotid Left vertebral artery Right anterior cerebral artery, A2 segment VESSELS STUDIED: Right internal carotid, head Left internal carotid, head Left vertebral artery, head Right internal carotid artery (pre embolization ) Right internal carotid artery (during embolization ) Right internal carotid artery (immediate post embolization ) Right internal carotid artery final control PROCEDURAL DESCRIPTION: The JB 1 catheter was introduced over the Glidewire, and the right internal carotid, left internal carotid, and left vertebral artery were sequentially catheterized with cerebral angiograms taken. After review of the images, the catheter was removed without incident. Under real-time fluoroscopy, the select catheter within the NeuronMax sheath was introduced and advanced into the right internal carotid artery over an 0.035" guidewire. The Shuttle sheath was then advanced into the proximal right internal carotid artery. The Select catheter was removed, and repeat roadmap was obtained. A Marksman microcatheter was then introduced and under roadmap guidance was advanced over a Synchro 2 standard microwire into the distal M1 segment on the right. The CatV catheter was then tracked over the Marksman to its final position in the horizontal cavernous internal carotid. The Marksman was then removed and the coiling catheter introduced over the microwire. The catheter was then navigated into the aneurysm lumen over the microwire. The wire was then removed. The above coils were then sequentially  deployed with progressive exclusion of the aneurysm. Angiograms were intermittently taken after coil deployment. Cerebral angiography was performed immediately post embolization. The coiling catheter was then removed and the CatV withdrawn into the cervical internal carotid artery. Final control angiography was then performed from the Navien guide catheter. The guide catheter and sheath were then synchronously removed without incident. The 8-Fr sheath was maintained on continuous flush and sutured in place. INTERPRETATION: Right femoral artery: The right femoral artery is unremarkable, arterial sheath in adequate position. Right internal carotid, head Injection reveals a widely patent right internal carotid artery, which has normal bifurcation into anterior and middle cerebral arteries. The distal branches of the ACA and MCA are unremarkable. The anterior communicating artery complex is identified, with a anteriorly projecting aneurysm seen. This aneurysm measures approximately 4.0 x 3.0 mm. It has relatively narrow neck arising from the anterior communicating artery itself. Capillary phase does not demonstrate any perfusion deficits. Venous phase is unremarkable. Left internal carotid Injection reveals a widely patent left internal carotid artery with normal bifurcation into anterior and middle cerebral arteries. 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 Distal branches of the ACA and MCA are unremarkable. No other aneurysms, AVM, or fistulas are seen. Capillary phase does not demonstrate any perfusion deficits. Venous phase is unremarkable. Left vertebral Left vertebral artery is widely patent, as is the basilar artery. The basilar bifurcation is normal. Posterior cerebral arteries are unremarkable. There is contrast reflux down the contralateral vertebral artery. No aneurysms are identified. There are no perfusion deficits. Venous  phase is unremarkable. Right internal carotid artery,  head (pre-embolization): Injection reveals the presence of a widely patent ICA that leads to a patent ACA and MCA. 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 The visualized portions of capillary and venous phases are unremarkable. Right internal carotid artery, head (during embolization): Angiograms taken during coil embolization demonstrate good position of the coil mass, without evidence of coil prolapse into the anterior cerebral arteries or anterior communicating artery. No filling defects are seen. Right internal carotid artery, head (immediate post-embolization): Injection reveals the presence of a widely patent ICA that leads to a patent ACA and MCA. Coil mass within the aneurysm is stable, without coil prolapse or filling defect to suggest thrombus. There does not appear to be any significant aneurysm filling seen. Right internal carotid artery, head (final control): Injection reveals the presence of a widely patent ICA that leads to a patent ACA and MCA. No thrombus is visualized. Coil mass is seen within the aneurysm without any aneurysm filling seen. Anterior communicating artery remains patent, with filling of the contralateral anterior circulation seen. The parenchymal and venous phases are unremarkable. DISPOSITION: After completion of the procedure, the femoral sheath was replaced with a 7 [place] ExoSeal closure device which was deployed successfully. The procedure was well tolerated and no early complications were observed. The patient was transferred to the neuro intensive care unit for further care. IMPRESSION: 1. 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 No aneurysm filling is seen post-embolization. 2. No other aneurysms, AVM, or high-flow fistulas are seen. Electronically Signed   By: Lisbeth Renshaw    On: 01/05/2017 17:10   Ir Angio Vertebral Sel Vertebral Uni L Mod Sed  Result Date: 12/30/2016 CLINICAL DATA:  The patient is a 66 year old woman presenting to the hospital with loss of consciousness after sudden onset of severe headache. CT scan demonstrated diffuse subarachnoid hemorrhage. She therefore presents for further workup with diagnostic cerebral angiogram and possible aneurysm coiling. OPERATOR: Dr. Lisbeth Renshaw EXAM: DIAGNOSTIC CEREBRAL ANGIOGRAM COIL EMBOLIZATION OF ANTERIOR COMMUNICATING ARTERY ANEURYSM PROCEDURE: The technical aspects of the procedure as well as its potential risks and benefits were reviewed with the patient. These risks included but were not limited to bleeding, infection, allergic reaction, damage to organs/vital structures, stroke, inability to repair vessel/place stent, delayed aneurysm rupture, and catastrophic outcomes of heart attack, coma, and death. With the understanding of these risks, informed consent was obtained and witnessed. The patient was placed in the supine position on the angiography table and the skin of the right groin was prepped in the usual sterile fashion. The procedure was performed under general anesthesia provided by the anesthesiology department. An 8 French sheath was introduced in the right femoral artery using Seldinger technique. Heparin: 0 units Fluoroscopy Time:  See IR records Contrast:  See IR records CATHETERS AND WIRES: 5-French JB-1 glidecatheter 180 cm 0.035" glidewire 6-French NeuronMax guide sheath 6-French Berenstein Select catheter 0.058" CatV guidecatheter 150 cm Marksman microcatheter Synchro 2 standard microwire Excelsior XT-17 straight microcatheter COILS USED (MRI compatible): Target 360 3x6 Nano Target 360 2.5 x 4 Nano x4 VESSELS CATHETERIZED: Right internal carotid Left internal carotid Left vertebral artery Right anterior cerebral artery, A2 segment VESSELS STUDIED: Right internal carotid, head Left internal carotid, head  Left vertebral artery, head Right internal carotid artery (pre embolization ) Right internal carotid artery (during embolization ) Right internal carotid artery (immediate post embolization ) Right internal carotid artery final control PROCEDURAL DESCRIPTION: The JB  1 catheter was introduced over the Glidewire, and the right internal carotid, left internal carotid, and left vertebral artery were sequentially catheterized with cerebral angiograms taken. After review of the images, the catheter was removed without incident. Under real-time fluoroscopy, the select catheter within the NeuronMax sheath was introduced and advanced into the right internal carotid artery over an 0.035" guidewire. The Shuttle sheath was then advanced into the proximal right internal carotid artery. The Select catheter was removed, and repeat roadmap was obtained. A Marksman microcatheter was then introduced and under roadmap guidance was advanced over a Synchro 2 standard microwire into the distal M1 segment on the right. The CatV catheter was then tracked over the Marksman to its final position in the horizontal cavernous internal carotid. The Marksman was then removed and the coiling catheter introduced over the microwire. The catheter was then navigated into the aneurysm lumen over the microwire. The wire was then removed. The above coils were then sequentially deployed with progressive exclusion of the aneurysm. Angiograms were intermittently taken after coil deployment. Cerebral angiography was performed immediately post embolization. The coiling catheter was then removed and the CatV withdrawn into the cervical internal carotid artery. Final control angiography was then performed from the Navien guide catheter. The guide catheter and sheath were then synchronously removed without incident. The 8-Fr sheath was maintained on continuous flush and sutured in place. INTERPRETATION: Right femoral artery: The right femoral artery is  unremarkable, arterial sheath in adequate position. Right internal carotid, head Injection reveals a widely patent right internal carotid artery, which has normal bifurcation into anterior and middle cerebral arteries. The distal branches of the ACA and MCA are unremarkable. The anterior communicating artery complex is identified, with a anteriorly projecting aneurysm seen. This aneurysm measures approximately 4.0 x 3.0 mm. It has relatively narrow neck arising from the anterior communicating artery itself. Capillary phase does not demonstrate any perfusion deficits. Venous phase is unremarkable. Left internal carotid Injection reveals a widely patent left internal carotid artery with normal bifurcation into anterior and middle cerebral arteries. 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 Distal branches of the ACA and MCA are unremarkable. No other aneurysms, AVM, or fistulas are seen. Capillary phase does not demonstrate any perfusion deficits. Venous phase is unremarkable. Left vertebral Left vertebral artery is widely patent, as is the basilar artery. The basilar bifurcation is normal. Posterior cerebral arteries are unremarkable. There is contrast reflux down the contralateral vertebral artery. No aneurysms are identified. There are no perfusion deficits. Venous phase is unremarkable. Right internal carotid artery, head (pre-embolization): Injection reveals the presence of a widely patent ICA that leads to a patent ACA and MCA. 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 The visualized portions of capillary and venous phases are unremarkable. Right internal carotid artery, head (during embolization): Angiograms taken during coil embolization demonstrate good position of the coil mass, without evidence of coil prolapse into the anterior cerebral arteries or anterior communicating artery. No filling defects are  seen. Right internal carotid artery, head (immediate post-embolization): Injection reveals the presence of a widely patent ICA that leads to a patent ACA and MCA. Coil mass within the aneurysm is stable, without coil prolapse or filling defect to suggest thrombus. There does not appear to be any significant aneurysm filling seen. Right internal carotid artery, head (final control): Injection reveals the presence of a widely patent ICA that leads to a patent ACA and MCA. No thrombus is  visualized. Coil mass is seen within the aneurysm without any aneurysm filling seen. Anterior communicating artery remains patent, with filling of the contralateral anterior circulation seen. The parenchymal and venous phases are unremarkable. DISPOSITION: After completion of the procedure, the femoral sheath was replaced with a 7 [place] ExoSeal closure device which was deployed successfully. The procedure was well tolerated and no early complications were observed. The patient was transferred to the neuro intensive care unit for further care. IMPRESSION: 1. 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 No aneurysm filling is seen post-embolization. 2. No other aneurysms, AVM, or high-flow fistulas are seen. Electronically Signed   By: Lisbeth Renshaw   On: 12/29/2016 17:10   Ir Neuro Each Add'l After Basic Uni Right (ms)  Result Date: 01/11/2017 CLINICAL DATA:  The patient is a 66 year old woman presenting to the hospital with loss of consciousness after sudden onset of severe headache. CT scan demonstrated diffuse subarachnoid hemorrhage. She therefore presents for further workup with diagnostic cerebral angiogram and possible aneurysm coiling. OPERATOR: Dr. Lisbeth Renshaw EXAM: DIAGNOSTIC CEREBRAL ANGIOGRAM COIL EMBOLIZATION OF ANTERIOR COMMUNICATING ARTERY ANEURYSM PROCEDURE: The technical aspects of the  procedure as well as its potential risks and benefits were reviewed with the patient. These risks included but were not limited to bleeding, infection, allergic reaction, damage to organs/vital structures, stroke, inability to repair vessel/place stent, delayed aneurysm rupture, and catastrophic outcomes of heart attack, coma, and death. With the understanding of these risks, informed consent was obtained and witnessed. The patient was placed in the supine position on the angiography table and the skin of the right groin was prepped in the usual sterile fashion. The procedure was performed under general anesthesia provided by the anesthesiology department. An 8 French sheath was introduced in the right femoral artery using Seldinger technique. Heparin: 0 units Fluoroscopy Time:  See IR records Contrast:  See IR records CATHETERS AND WIRES: 5-French JB-1 glidecatheter 180 cm 0.035" glidewire 6-French NeuronMax guide sheath 6-French Berenstein Select catheter 0.058" CatV guidecatheter 150 cm Marksman microcatheter Synchro 2 standard microwire Excelsior XT-17 straight microcatheter COILS USED (MRI compatible): Target 360 3x6 Nano Target 360 2.5 x 4 Nano x4 VESSELS CATHETERIZED: Right internal carotid Left internal carotid Left vertebral artery Right anterior cerebral artery, A2 segment VESSELS STUDIED: Right internal carotid, head Left internal carotid, head Left vertebral artery, head Right internal carotid artery (pre embolization ) Right internal carotid artery (during embolization ) Right internal carotid artery (immediate post embolization ) Right internal carotid artery final control PROCEDURAL DESCRIPTION: The JB 1 catheter was introduced over the Glidewire, and the right internal carotid, left internal carotid, and left vertebral artery were sequentially catheterized with cerebral angiograms taken. After review of the images, the catheter was removed without incident. Under real-time fluoroscopy, the select  catheter within the NeuronMax sheath was introduced and advanced into the right internal carotid artery over an 0.035" guidewire. The Shuttle sheath was then advanced into the proximal right internal carotid artery. The Select catheter was removed, and repeat roadmap was obtained. A Marksman microcatheter was then introduced and under roadmap guidance was advanced over a Synchro 2 standard microwire into the distal M1 segment on the right. The CatV catheter was then tracked over the Marksman to its final position in the horizontal cavernous internal carotid. The Marksman was then removed and the coiling catheter introduced over the microwire. The catheter was then navigated into the aneurysm lumen over the  microwire. The wire was then removed. The above coils were then sequentially deployed with progressive exclusion of the aneurysm. Angiograms were intermittently taken after coil deployment. Cerebral angiography was performed immediately post embolization. The coiling catheter was then removed and the CatV withdrawn into the cervical internal carotid artery. Final control angiography was then performed from the Navien guide catheter. The guide catheter and sheath were then synchronously removed without incident. The 8-Fr sheath was maintained on continuous flush and sutured in place. INTERPRETATION: Right femoral artery: The right femoral artery is unremarkable, arterial sheath in adequate position. Right internal carotid, head Injection reveals a widely patent right internal carotid artery, which has normal bifurcation into anterior and middle cerebral arteries. The distal branches of the ACA and MCA are unremarkable. The anterior communicating artery complex is identified, with a anteriorly projecting aneurysm seen. This aneurysm measures approximately 4.0 x 3.0 mm. It has relatively narrow neck arising from the anterior communicating artery itself. Capillary phase does not demonstrate any perfusion deficits.  Venous phase is unremarkable. Left internal carotid Injection reveals a widely patent left internal carotid artery with normal bifurcation into anterior and middle cerebral arteries. 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 Distal branches of the ACA and MCA are unremarkable. No other aneurysms, AVM, or fistulas are seen. Capillary phase does not demonstrate any perfusion deficits. Venous phase is unremarkable. Left vertebral Left vertebral artery is widely patent, as is the basilar artery. The basilar bifurcation is normal. Posterior cerebral arteries are unremarkable. There is contrast reflux down the contralateral vertebral artery. No aneurysms are identified. There are no perfusion deficits. Venous phase is unremarkable. Right internal carotid artery, head (pre-embolization): Injection reveals the presence of a widely patent ICA that leads to a patent ACA and MCA. 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 The visualized portions of capillary and venous phases are unremarkable. Right internal carotid artery, head (during embolization): Angiograms taken during coil embolization demonstrate good position of the coil mass, without evidence of coil prolapse into the anterior cerebral arteries or anterior communicating artery. No filling defects are seen. Right internal carotid artery, head (immediate post-embolization): Injection reveals the presence of a widely patent ICA that leads to a patent ACA and MCA. Coil mass within the aneurysm is stable, without coil prolapse or filling defect to suggest thrombus. There does not appear to be any significant aneurysm filling seen. Right internal carotid artery, head (final control): Injection reveals the presence of a widely patent ICA that leads to a patent ACA and MCA. No thrombus is visualized. Coil mass is seen within the aneurysm without any aneurysm filling seen.  Anterior communicating artery remains patent, with filling of the contralateral anterior circulation seen. The parenchymal and venous phases are unremarkable. DISPOSITION: After completion of the procedure, the femoral sheath was replaced with a 7 [place] ExoSeal closure device which was deployed successfully. The procedure was well tolerated and no early complications were observed. The patient was transferred to the neuro intensive care unit for further care. IMPRESSION: 1. 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 No aneurysm filling is seen post-embolization. 2. No other aneurysms, AVM, or high-flow fistulas are seen. Electronically Signed   By: Lisbeth Renshaw   On: 2017/01/28 17:10     STUDIES:  6/6 CT head > Massive subarachnoid hemorrhage over both convexities and within the sylvian fissures, basal cisterns and  the lateral and third ventricles. Given the distribution, an aneurysmal etiology is favored, though other entities may also cause this distribution. However, it is somewhat unclear whether there may be a component of intraparenchymal hemorrhage of the inferomedial frontal lobe. CTA of the head is recommended. Effacement of the basal cisterns and crowding of the foramen magnum with early/impending herniation of the cerebellar tonsils.  CULTURES: MRSA 6/6>> Neg  ANTIBIOTICS: None to date  SIGNIFICANT EVENTS: 6/6 admit  LINES/TUBES: ett 6/6 >  DISCUSSION: SAH w/ inability to protect airway. 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 P:  Daily chem Replace as needed  NS @ 100 Check Mag and Phos 6/8 Replete K 6/8  GASTROINTESTINAL A:   No acute issues P:   NPO OGT to LIS Initiate TF per dietitian ( Order placed)  HEMATOLOGIC A:   Anemia No Obvious S/S bleeding P:  Follow CBC daily Transfuse per protocol SCDs only  INFECTIOUS A:   Possible aspiration P:   Follow WBC and fever curve daily Monitor off ABX Culture if clinically indicated  ENDOCRINE A:   Hyperglycemia P:   SSI  FAMILY  - Updates: no family at bedside  - Inter-disciplinary family meet or Palliative Care meeting due by:  6/12  My ccm time 30 minutes  Bevelyn Ngo, AGACNP-BC [facility] Pulmonary/Critical Care Medicine Pager # [phone] 01/04/2017 12:14 PM  STAFF NOTE: Cindi Carbon, MD FACP have personally reviewed patient's available data, including medical history, events of note, physical examination and test results as part of my evaluation. I have discussed with resident/NP and other care providers such as pharmacist, RN and RRT. In addition, I personally evaluated patient and elicited key findings of: no posturing,  Moving left upper ext to pain and open eyes to  Pain, lungs clear, abdo soft, no edema, had pos balance, had IR coiling, MAP is at goal 80, would avoid over 105, can feed today, SBT planned, keep about same MV on rest mode,obtain pcxr in am, no signs of infection , will need TCD as risk vasospasm will be high with volume blood, her echo shows high EF  Indicating can tolerate volume, I think her prognosis is poor given classification on admission,  nimodipine tolerated, keppra would consider 21 days ( given risk seziures in first 3 weeks with this amount of sah) , EEG reviewed withtout focus, remains with ome eye deviation The patient is critically ill with multiple organ systems failure and requires high complexity decision making for assessment and support, frequent evaluation and titration of therapies, application of advanced monitoring technologies and extensive interpretation of multiple databases.   Critical Care Time devoted to patient care services described in this note is 30 Minutes. This time reflects time of care of this signee: Rory Percy, MD FACP. This critical  care time does not reflect procedure time, or teaching time or supervisory time of PA/NP/Med student/Med Resident etc but could involve care discussion time. Rest per NP/medical resident whose note is outlined above and that I agree with   Mcarthur Rossetti. Tyson Alias, MD, FACP Pgr: [phone] [facility] 01/04/2017 12:46 PM

## 2017-01-04 NOTE — Progress Notes (Signed)
Transcranial Doppler  Date POD PCO2 HCT BP  MCA ACA PCA OPHT SIPH VERT Basilar  6/8 JE   33.6 145/53 Right  Left   56  *   *  *   *  *   24  25   *  *   -21  *   *           Right  Left                                            Right  Left                                             Right  Left                                             Right  Left                                            Right  Left                                            Right  Left                                        MCA = Middle Cerebral Artery      OPHT = Opthalmic Artery     BASILAR = Basilar Artery   ACA = Anterior Cerebral Artery     SIPH = Carotid Siphon PCA = Posterior Cerebral Artery   VERT = Verterbral Artery                   Normal MCA = 62+\-12 ACA = 50+\-12 PCA = 42+\-23   6/8 Rt Lindegaard ratio 2.1. Very poor study due to poor windows and patient forcefully keeping head turned to left. JE

## 2017-01-04 NOTE — Progress Notes (Signed)
STROKE TEAM PROGRESS NOTE   SUBJECTIVE (INTERVAL HISTORY) Son is at bedside. Pt still intubated on propofol, not following commands. Dr. Conchita ParisNundkumar had ACOM aneurysm coiling yesterday. Neuro slightly improved without posturing on pain stimulation.    OBJECTIVE Temp:  [98.6 F (37 C)-99.1 F (37.3 C)] 99.1 F (37.3 C) (06/08 1000) Pulse Rate:  [59-91] 59 (06/08 1000) Cardiac Rhythm: Normal sinus rhythm;Ventricular paced (06/08 0800) Resp:  [17-31] 17 (06/08 1000) BP: (101-159)/(50-88) 114/53 (06/08 1000) SpO2:  [90 %-100 %] 95 % (06/08 1000) Arterial Line BP: (121-180)/(48-67) 134/52 (06/08 1000) FiO2 (%):  [40 %] 40 % (06/08 0809) Weight:  [171 lb 11.8 oz (77.9 kg)] 171 lb 11.8 oz (77.9 kg) (06/08 0457)  CBC:  Recent Labs Lab 01/01/2017 1810  01/20/2017 0248 01/04/17 0556  WBC 12.9*  --  12.5* 12.0*  NEUTROABS 5.3  --   --   --   HGB 11.9*  < > 12.6 11.1*  HCT 36.0  < > 37.4 33.6*  MCV 85.9  --  84.8 86.2  PLT 236  --  211 207  < > = values in this interval not displayed.  Basic Metabolic Panel:   Recent Labs Lab 01/14/2017 0248 01/04/17 0556  NA 140 144  K 3.9 3.0*  CL 106 118*  CO2 20* 17*  GLUCOSE 195* 230*  BUN 15 11  CREATININE 0.87 0.85  CALCIUM 9.0 7.5*  MG 1.6*  --   PHOS 1.8*  --     Lipid Panel:     Component Value Date/Time   TRIG 279 (H) 01/06/2017 2225   HgbA1c: No results found for: HGBA1C Urine Drug Screen:     Component Value Date/Time   LABOPIA NONE DETECTED 12/31/2016 1821   COCAINSCRNUR NONE DETECTED 01/26/2017 1821   LABBENZ NONE DETECTED 01/20/2017 1821   AMPHETMU NONE DETECTED 01/08/2017 1821   THCU NONE DETECTED 01/01/2017 1821   LABBARB NONE DETECTED 01/23/2017 1821    Alcohol Level     Component Value Date/Time   ETH <5 01/14/2017 1810    IMAGING I have personally reviewed the radiological images below and agree with the radiology interpretations.  Ct Head Wo Contrast 12/28/2016 1. Massive subarachnoid hemorrhage over both  convexities and within the sylvian fissures, basal cisterns and the lateral and third ventricles. Given the distribution, an aneurysmal etiology is favored, though other entities may also cause this distribution. However, it is somewhat unclear whether there may be a component of intraparenchymal hemorrhage of the inferomedial frontal lobe. CTA of the head is recommended. 2. Effacement of the basal cisterns and crowding of the foramen magnum with early/impending herniation of the cerebellar tonsils.   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 No aneurysm filling is seen post-embolization.   TTE - Left ventricle: The cavity size was normal. There was moderate   focal basal hypertrophy. Systolic function was vigorous. The   estimated ejection fraction was in the range of 65% to 70%. Wall   motion was normal; there were no regional wall motion   abnormalities. Left ventricular diastolic function parameters   were normal. - Aortic valve: Poorly visualized. Valve area (VTI): 1.91 cm^2.   Valve area (Vmax): 1.83 cm^2. Valve area (Vmean): 1.9 cm^2.  EEG - This is an abnormal EEG due to moderate diffuse generalized slowing with poor reactivity and disorganization of the background. No seizures. There is a greater degree of slowing in  the right frontal region suggesting more localized cerebral dysfunction in that area. Clinical correlation: This study is consistent with moderate global cerebral dysfunction with possibly greater dysfunction in the right frontal region. No evidence of seizures. Clinical correlation is recommended.  TCD pending   PHYSICAL EXAM  Temp:  [98.6 F (37 C)-99.1 F (37.3 C)] 99.1 F (37.3 C) (06/08 1000) Pulse Rate:  [59-91] 59 (06/08 1000) Resp:  [17-31] 17 (06/08 1000) BP: (101-159)/(50-88) 114/53 (06/08 1000) SpO2:  [90 %-100 %] 95 % (06/08  1000) Arterial Line BP: (121-180)/(48-67) 134/52 (06/08 1000) FiO2 (%):  [40 %] 40 % (06/08 0809) Weight:  [171 lb 11.8 oz (77.9 kg)] 171 lb 11.8 oz (77.9 kg) (06/08 0457)  General - Well nourished, well developed, intubated on sedation.  Ophthalmologic - Fundi not visualized due to small pupils.  Cardiovascular - Regular rate and rhythm.  Neuro - intubated on sedation, not open eyes or following commands. Eyes middle position, conjugate, but slight doll's eyes on head moving. Pupils 2mm equal round, but very sluggish to light. Weak corneals but positive gag. Breathing over the vent. On pain stimulation, no more decorticate posturing but able to withdraw in all extremities with LUE more movement than other limbs mild withdraw on BLEs. Increased tone bilaterally, with bilateral babinski. Sensation, coordination and gait not tested.   ASSESSMENT/PLAN [name] is a 66 y.o. female with history of HTN presenting with severe HA which progressed to unresponsive state, seizure activity and vomiting. CT showed a extensive SAH.   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 Effacement of the basal cisterns with early/impending cerebellar herniation  Cerebral angio successfully coiled ruptured ACOM aneurysm  NSG on board  TCD pending  TTE EF 65-70%  EEG diffuse slowing and no seizure  SCDs for VTE prophylaxis Diet NPO time specified  No antithrombotic prior to admission  Nimodipine started 01-30-17  Ongoing aggressive stroke risk factor management  Therapy recommendations:  pending   Disposition:  Pending   Respiratory failure  Secondary to [facility]  Intubated in ED  managemed by CCM  Hypertensive Emergency  SBP 300 palp on scene  On cardene for BP control  SBP goal < 160   Currently BP at goal.    ? Seizure - more likely stimulation induced  decorticate posturing with unsustained LUE myoclonus, resolved on today's exam  High risk for seizure  on Keppra 1000mg  bid  EEG diffuse slowing but no seizure  Seizure precuation  Other Stroke Risk Factors  Advanced age  Other Active Problems  hypocalcemia  Hypokalemia  Hospital day # 2  This patient is critically ill due to extensive SAH and coma with posturing and at significant risk of neurological worsening, death form recurrent hemorrhage, hydrocephalus, seizure, and vasospasm. This patient's care requires constant monitoring of vital signs, hemodynamics, respiratory and cardiac monitoring, review of multiple databases, neurological assessment, discussion with family, other specialists and medical decision making of high complexity. I spent 35 minutes of neurocritical care time in the care of this patient.  Marvel Plan, MD PhD Stroke Neurology 01/04/2017 10:55 AM  To contact Stroke Continuity provider, please refer to [email or web address]. After hours, contact General Neurology

## 2017-01-04 NOTE — Progress Notes (Signed)
Inpatient Diabetes Program Recommendations  AACE/ADA: New Consensus Statement on Inpatient Glycemic Control (2015)  Target Ranges:  Prepandial:   less than 140 mg/dL      Peak postprandial:   less than 180 mg/dL (1-2 hours)      Critically ill patients:  140 - 180 mg/dL   Results for Sherri Hale, [name] (MRN [id number]) as of 01/04/2017 10:53  Ref. Range 01/04/2017 05:56  Glucose Latest Ref Range: 65 - 99 mg/dL 841230 (H)   Review of Glycemic Control  Diabetes history: None  Inpatient Diabetes Program Recommendations:    Lab glucose 230 mg/dl this am at [id number] am. Consider CBG checks and possibly Novolog Sensitive Correction. Consider A1c level.  Thanks,  Christena DeemShannon Angelos Wasco RN, MSN, [facility] Inpatient Diabetes Coordinator Team Pager [phone] (8a-5p)

## 2017-01-04 NOTE — Care Management Note (Signed)
Case Management Note Original Note created by Lawerance Sabalebbie Swist  Patient Details  Name: Thom Chimeslicia Correa Basto MRN: [id number] Date of Birth: 26-Jan-1951  Subjective/Objective:                 [facility] HTN proceeding SAH. Plan for diagnostic angiogram and treatment of any identified aneurysm. Intubated. Plan and GOC will be pending results of angiogram.    Action/Plan:  CM will continue to follow.  Expected Discharge Date:                  Expected Discharge Plan:     In-House Referral:     Discharge planning Services  CM Consult  Post Acute Care Choice:    Choice offered to:     DME Arranged:    DME Agency:     HH Arranged:    HH Agency:     Status of Service:  In process, will continue to follow  If discussed at Long Length of Stay Meetings, dates discussed:    Additional Comments: 01/04/2017 Pt remains intubated.   Cherylann ParrClaxton, Junior Huezo S, RN 01/04/2017, 3:38 PM

## 2017-01-04 NOTE — Progress Notes (Signed)
eLink Physician-Brief Progress Note Patient Name: Sherri Hale Chimeslicia Correa Basto DOB: June 01, 1951 MRN: [id number]   Date of Service  01/04/2017  HPI/Events of Note  Blood glucose = 347.  eICU Interventions  Will order: 1. Q 4 hour sensitive Novolog SSI.     Intervention Category Major Interventions: Hyperglycemia - active titration of insulin therapy  Lenell AntuSommer,Alanna Storti Eugene 01/04/2017, 8:29 PM

## 2017-01-05 ENCOUNTER — Inpatient Hospital Stay (HOSPITAL_COMMUNITY): Payer: Medicare HMO

## 2017-01-05 LAB — CBC
HCT: 34.1 % — ABNORMAL LOW (ref 36.0–46.0)
Hemoglobin: 10.9 g/dL — ABNORMAL LOW (ref 12.0–15.0)
MCH: 28.3 pg (ref 26.0–34.0)
MCHC: 32 g/dL (ref 30.0–36.0)
MCV: 88.6 fL (ref 78.0–100.0)
PLATELETS: 228 10*3/uL (ref 150–400)
RBC: 3.85 MIL/uL — ABNORMAL LOW (ref 3.87–5.11)
RDW: 14.2 % (ref 11.5–15.5)
WBC: 12.9 10*3/uL — ABNORMAL HIGH (ref 4.0–10.5)

## 2017-01-05 LAB — BASIC METABOLIC PANEL
ANION GAP: 7 (ref 5–15)
BUN: 26 mg/dL — ABNORMAL HIGH (ref 6–20)
CALCIUM: 7.7 mg/dL — AB (ref 8.9–10.3)
CO2: 13 mmol/L — AB (ref 22–32)
CREATININE: 1.13 mg/dL — AB (ref 0.44–1.00)
Chloride: 127 mmol/L — ABNORMAL HIGH (ref 101–111)
GFR, EST AFRICAN AMERICAN: 57 mL/min — AB (ref >60)
GFR, EST NON AFRICAN AMERICAN: 50 mL/min — AB (ref >60)
Glucose, Bld: 303 mg/dL — ABNORMAL HIGH (ref 65–99)
Potassium: 3.7 mmol/L (ref 3.5–5.1)
Sodium: 147 mmol/L — ABNORMAL HIGH (ref 135–145)

## 2017-01-05 LAB — GLUCOSE, CAPILLARY
GLUCOSE-CAPILLARY: 369 mg/dL — AB (ref 65–99)
Glucose-Capillary: 162 mg/dL — ABNORMAL HIGH (ref 65–99)
Glucose-Capillary: 269 mg/dL — ABNORMAL HIGH (ref 65–99)
Glucose-Capillary: 283 mg/dL — ABNORMAL HIGH (ref 65–99)
Glucose-Capillary: 295 mg/dL — ABNORMAL HIGH (ref 65–99)
Glucose-Capillary: 304 mg/dL — ABNORMAL HIGH (ref 65–99)

## 2017-01-05 LAB — MAGNESIUM
MAGNESIUM: 2 mg/dL (ref 1.7–2.4)
Magnesium: 2.1 mg/dL (ref 1.7–2.4)

## 2017-01-05 LAB — PHOSPHORUS
PHOSPHORUS: 2.1 mg/dL — AB (ref 2.5–4.6)
Phosphorus: 4.3 mg/dL (ref 2.5–4.6)

## 2017-01-05 MED ORDER — HYDROCHLOROTHIAZIDE 25 MG PO TABS: 25.0000 mg | ORAL_TABLET | Freq: Every day | ORAL | Status: DC

## 2017-01-05 MED ORDER — HYDROCHLOROTHIAZIDE 10 MG/ML ORAL SUSPENSION
25.0000 mg | Freq: Every day | ORAL | Status: DC
  Filled 2017-01-05: qty 2.5

## 2017-01-05 MED ORDER — INSULIN GLARGINE 100 UNIT/ML ~~LOC~~ SOLN
10.0000 [IU] | Freq: Every day | SUBCUTANEOUS | Status: DC
  Administered 2017-01-05 – 2017-01-12 (×8): 10 [IU] via SUBCUTANEOUS
  Filled 2017-01-05 (×9): qty 0.1

## 2017-01-05 MED ORDER — SODIUM CHLORIDE 0.9% FLUSH
10.0000 mL | Freq: Two times a day (BID) | INTRAVENOUS | Status: DC
  Administered 2017-01-05: 20 mL
  Administered 2017-01-06 – 2017-01-07 (×3): 10 mL
  Administered 2017-01-08: 30 mL
  Administered 2017-01-08 – 2017-01-09 (×2): 10 mL
  Administered 2017-01-09: 30 mL
  Administered 2017-01-10 – 2017-01-12 (×4): 10 mL

## 2017-01-05 MED ORDER — HYDROCHLOROTHIAZIDE 10 MG/ML ORAL SUSPENSION
25.0000 mg | Freq: Every day | ORAL | Status: DC
  Administered 2017-01-05 – 2017-01-06 (×2): 25 mg
  Filled 2017-01-05 (×3): qty 2.5

## 2017-01-05 MED ORDER — SODIUM CHLORIDE 0.9% FLUSH
10.0000 mL | INTRAVENOUS | Status: DC | PRN
  Administered 2017-01-11: 10 mL
  Filled 2017-01-05: qty 40

## 2017-01-05 MED ORDER — LOSARTAN POTASSIUM 50 MG PO TABS
25.0000 mg | ORAL_TABLET | Freq: Every day | ORAL | Status: DC
  Administered 2017-01-05 – 2017-01-06 (×2): 25 mg
  Filled 2017-01-05 (×2): qty 1

## 2017-01-05 MED ORDER — POTASSIUM PHOSPHATES 15 MMOLE/5ML IV SOLN
30.0000 mmol | Freq: Once | INTRAVENOUS | Status: AC
  Administered 2017-01-05: 30 mmol via INTRAVENOUS
  Filled 2017-01-05: qty 10

## 2017-01-05 MED ORDER — CHLORHEXIDINE GLUCONATE CLOTH 2 % EX PADS
6.0000 | MEDICATED_PAD | Freq: Every day | CUTANEOUS | Status: DC
  Administered 2017-01-05 – 2017-01-12 (×7): 6 via TOPICAL

## 2017-01-05 MED ORDER — AMPICILLIN-SULBACTAM SODIUM 3 (2-1) G IJ SOLR
3.0000 g | Freq: Four times a day (QID) | INTRAMUSCULAR | Status: DC
  Administered 2017-01-05 – 2017-01-09 (×15): 3 g via INTRAVENOUS
  Filled 2017-01-05 (×16): qty 3

## 2017-01-05 NOTE — Progress Notes (Signed)
Dear Doctor: Christene Slatese Dios This patient has been identified as a candidate for PICC for the following reason (s): IV therapy over 48 hours, poor veins/poor circulatory system (CHF, COPD, emphysema, diabetes, steroid use, IV drug abuse, etc.), restarts due to phlebitis and infiltration in 24 hours and incompatible drugs (aminophyllin, TPN, heparin, given with an antibiotic) If you agree, please write an order for the indicated device. For any questions contact the Vascular Access Team at [phone] if no answer, please leave a message.  Thank you for supporting the early vascular access assessment program.

## 2017-01-05 NOTE — Progress Notes (Signed)
PULMONARY / CRITICAL CARE MEDICINE   Name: Sherri Hale MRN: [id number] DOB: [birth year]    ADMISSION DATE:  01/18/2017 CONSULTATION DATE:  6/6  REFERRING MD:  Dr. Verdie Mosher EDP  CHIEF COMPLAINT:  SAH  BRIEF SUMMARY:  66 year old female with past medical history of hypertension who presented to [facility] emergency department 6/6 via EMS after collapsing at [facility]. While at the [facility] she was complaining of severe headache and then suddenly became unresponsive. She developed altered mental status and vomited.  Intubated for airway protection.  Found to be profoundly hypertensive with systolic blood pressures in the 230s. In the emergency department she was started on nicardipine infusion and loaded with IV Keppra. She was taken for a CT scan of the head which showed extensive subarachnoid hemorrhage. She was evaluated by Neurosurgery.   Ultimately went to Neuro IR for coiling.    SUBJECTIVE:  RN reports need for possible IV access, BM x1.  Remains on NS at , propofol at 20 mcg and cardene gtt 7 mg.  Tmax 99, thick secretions from ETT.  WBC 12.9. Hyperglycemia overnight.   VITAL SIGNS: Temp:  [97.9 F (36.6 C)-99.9 F (37.7 C)] 99 F (37.2 C) (06/09 0800) Pulse Rate:  [59-88] 70 (06/09 0800) Resp:  [17-41] 28 (06/09 0800) BP: (114-171)/(46-81) 123/46 (06/09 0800) SpO2:  [91 %-99 %] 95 % (06/09 0816) Arterial Line BP: (124-171)/(45-61) 125/46 (06/09 0800) FiO2 (%):  [40 %] 40 % (06/09 0821) Weight:  [177 lb 7.5 oz (80.5 kg)] 177 lb 7.5 oz (80.5 kg) (06/09 0425)  HEMODYNAMICS:    VENTILATOR SETTINGS: Vent Mode: PRVC FiO2 (%):  [40 %] 40 % Set Rate:  [20 bmp] 20 bmp Vt Set:  [490 mL] 490 mL PEEP:  [5 cmH20] 5 cmH20 Pressure Support:  [5 cmH20] 5 cmH20 Plateau Pressure:  [17 cmH20-21 cmH20] 18 cmH20  INTAKE / OUTPUT:  Intake/Output Summary (Last 24 hours) at 01/05/17 4098 Last data filed at 01/05/17 0800  Gross per 24 hour  Intake          6591.54 ml  Output               965 ml  Net          5626.54 ml     PHYSICAL EXAMINATION: General: adult female in NAD on vent HEENT: MM pink/moist, ETT with thick secretions Neuro: responsive to pain, no response to verbal stimuli, coughs with stimulation CV: s1s2 rrr, no m/r/g PULM: even/non-labored, coarse rhonchi bilaterally  JX:BJYN, non-tender, bsx4 active  Extremities: warm/dry, 1-2+ generalized edema  Skin: no rashes or lesions  LABS:  BMET  Recent Labs Lab 12/29/2016 0248 01/04/17 0556 01/05/17 0435  NA 140 144 147*  K 3.9 3.0* 3.7  CL 106 118* 127*  CO2 20* 17* 13*  BUN 15 11 26*  CREATININE 0.87 0.85 1.13*  GLUCOSE 195* 230* 303*    Electrolytes  Recent Labs Lab 01/12/2017 0248 01/04/17 0556 01/04/17 1638 01/05/17 0435  CALCIUM 9.0 7.5*  --  7.7*  MG 1.6*  --  2.1 2.1  PHOS 1.8*  --  2.0* 2.1*    CBC  Recent Labs Lab 01/19/2017 0248 01/04/17 0556 01/05/17 0435  WBC 12.5* 12.0* 12.9*  HGB 12.6 11.1* 10.9*  HCT 37.4 33.6* 34.1*  PLT 211 207 228    Coag's  Recent Labs Lab 01/18/2017 1810  APTT 34  INR 0.97    Sepsis Markers No results for input(s): LATICACIDVEN, PROCALCITON, O2SATVEN in  the last 168 hours.  ABG  Recent Labs Lab 01/01/2017 1931 01/11/2017 0350 01/05/17 0408  PHART 7.471* 7.441 7.299*  PCO2ART 34.7 32.3 23.7*  PO2ART 357.0* 137* 74.2*    Liver Enzymes  Recent Labs Lab 01/10/2017 1810 01/04/17 0556  AST 67* 39  ALT 64* 67*  ALKPHOS 107 94  BILITOT 0.8 1.4*  ALBUMIN 3.7 3.3*    Cardiac Enzymes No results for input(s): TROPONINI, PROBNP in the last 168 hours.  Glucose  Recent Labs Lab 01/04/17 1641 01/04/17 1953 01/04/17 2324 01/05/17 0333 01/05/17 0743  GLUCAP 270* 376* 361* 304* 283*    Imaging Dg Chest Port 1 View  Result Date: 01/05/2017 CLINICAL DATA:  Hypoxia EXAM: PORTABLE CHEST 1 VIEW COMPARISON:  January 03, 2017 FINDINGS: Endotracheal tube tip is 2.1 cm above the carina. Nasogastric tube tip and side port are  below the diaphragm. No pneumothorax. Pacemaker leads are unchanged in position. There is slight atelectatic change in the left base. Lungs elsewhere clear. Heart is upper normal in size with pulmonary vascular within normal limits. There is aortic atherosclerosis. No adenopathy. No bone lesions. IMPRESSION: Tube positions as described without evident pneumothorax. Left base atelectasis. Lungs elsewhere clear. Stable cardiac silhouette. Electronically Signed   By: Bretta BangWilliam  Woodruff III M.D.   On: 01/05/2017 07:59     STUDIES:  6/6 CT head >> Massive subarachnoid hemorrhage over both convexities and within the sylvian fissures, basal cisterns and the lateral and third ventricles. Given the distribution, an aneurysmal etiology is favored, though other entities may also cause this distribution. However, it is somewhat unclear whether there may be a component of intraparenchymal hemorrhage of the inferomedial frontal lobe. CTA of the head is recommended. Effacement of the basal cisterns and crowding of the foramen magnum with early/impending herniation of the cerebellar tonsils. 6/  EEG >> diffuse slowing, no seizure  CULTURES: MRSA 6/6 >> Neg  ANTIBIOTICS:   SIGNIFICANT EVENTS: 6/06  Admit 6/07  ACOM aneurysm coiling  6/09  Remains intubated  LINES/TUBES: ETT 6/6 >> L Rad Aline 6/6 >>   DISCUSSION: SAH w/ inability to protect airway. 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 Seizures - felt to be related to stimulation induced decorticate posturing, resolved P:   RASS goal: -1 to -2  Neurology + NSGY following, appreciate input  Continue propofol for sedation  Continue Keppra, nimodipine Defer further neuroimaging to Neurology / NSGY   PULMONARY A: Airway compromise secondary to SAH (grade IV) P:   PRVC 8 cc/kg  Wean PEEP / FiO2 for sats > 92% Intermittent CXR Monitor ETT secretions Neuro status  prohibits weaning  CARDIOVASCULAR A:  Hypertensive emergency P:  ICU monitoring  SBP goal 140-160  Continue nicardipine for BP goals Hold home losartan, HCTZ for now Continue Nimodipine  RENAL A:   Fluid and electrolyte imbalance Hypokalemia  Cannot see if urine osm sent? P:  Trend BMP / urinary output Replace electrolytes as indicated Avoid nephrotoxic agents, ensure adequate renal perfusion Continue NS @ 150 ml/hr  KPhos 6/9  GASTROINTESTINAL A:   No acute issues P:   NPO / OGT  TF per Nutrition  PPI for SUP   HEMATOLOGIC A:   Anemia No Obvious S/S bleeding P:  Trend CBC  SCD's for DVT prophylaxis   INFECTIOUS A:   Possible aspiration P:   Follow WBC / fever curve  Consider abx for aspiration if  fever spike  ENDOCRINE A:   Hyperglycemia P:   CBG with SSI  Add Lantus 10 units QD  FAMILY  - Updates: No family available on am rounds 6/9.  Will attempt to up date on PM rounds  - Inter-disciplinary family meet or Palliative Care meeting due by:  6/12  CC Time:  30 minutes    Canary Brim, NP-C [facility] Pgr: [phone] or if no answer [phone] 01/05/2017, 9:22 AM  Attending Note:  I have examined patient, reviewed labs, studies and notes. I have discussed the case with B Ollis, and I agree with the data and plans as amended above. 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 She has been on propofol and nicardipine for both sedation and blood pressure control. Yellow secretions reported. Her white blood cell count is slightly elevated, chest x-ray shows by basilar atelectasis especially on the left. On my exam she is unresponsive to voice she will grimace to pain. She has an intact cough. Her lungs are rhonchorous bilaterally. Heart regular without a murmur. Abdomen soft benign,  positive bowel sounds. We will continue her current blood pressure control with nicardipine. Add Unasyn for possible aspiration and culture her sputum. Adjust Lantus. PICC line will be ordered today. She is currently on normal saline 150 mL per hour for vasospasm. Defer to neurology with regard to duration of this. Independent critical care time is 31 minutes.   Levy Pupa, MD, PhD 01/05/2017, 3:01 PM  Pulmonary and Critical Care [phone] or if no answer [phone]

## 2017-01-05 NOTE — Progress Notes (Addendum)
Pharmacy Antibiotic Note  Thom Chimeslicia Correa Basto is a 66 y.o. female admitted on 01/23/2017 with pneumonia.  Pharmacy has been consulted for unasyn dosing. WBC 12.9, afebrile, sCr 1.1 CrCl ~ 50 mL/min  Plan: Unasyn 3G IV q6 F/u LOT, clinical progression  Height: 5\' 3"  (160 cm) Weight: 177 lb 7.5 oz (80.5 kg) IBW/kg (Calculated) : 52.4  Temp (24hrs), Avg:99.4 F (37.4 C), Min:97.9 F (36.6 C), Max:99.9 F (37.7 C)   Recent Labs Lab 2017-07-07 1810 2017-07-07 1824 01/07/2017 0248 01/04/17 0556 01/05/17 0435  WBC 12.9*  --  12.5* 12.0* 12.9*  CREATININE 0.83 0.80 0.87 0.85 1.13*    Estimated Creatinine Clearance: 49.2 mL/min (A) (by C-G formula based on SCr of 1.13 mg/dL (H)).    No Known Allergies  Antimicrobials this admission: 6/9 Unasyn>>  Dose adjustments this admission:   Microbiology results: 6/6 Sputum: pending 6/6 MRSA PCR: neg  Thank you for allowing pharmacy to be a part of this patient's care.  Toniann Failony L Candus Braud 01/05/2017 3:32 PM

## 2017-01-05 NOTE — Progress Notes (Signed)
Peripherally Inserted Central Catheter/Midline Placement  The IV Nurse has discussed with the patient and/or persons authorized to consent for the patient, the purpose of this procedure and the potential benefits and risks involved with this procedure.  The benefits include less needle sticks, lab draws from the catheter, and the patient may be discharged home with the catheter. Risks include, but not limited to, infection, bleeding, blood clot (thrombus formation), and puncture of an artery; nerve damage and irregular heartbeat and possibility to perform a PICC exchange if needed/ordered by physician.  Alternatives to this procedure were also discussed.  Bard Power PICC patient education guide, fact sheet on infection prevention and patient information card has been provided to patient /or left at bedside.  Consent obtained from dtr by Tammy RN on telephone.  Once arrived to room, son present at bedside, also gave verbal consent for PICC placement.  Tammy RN also witnessed.  PICC/Midline Placement Documentation  PICC Triple Lumen 01/05/17 PICC Right Cephalic 34 cm 1 cm (Active)  Indication for Insertion or Continuance of Line Vasoactive infusions;Prolonged intravenous therapies;Limited venous access - need for IV therapy >5 days (PICC only) 01/05/2017 12:06 PM  Exposed Catheter (cm) 1 cm 01/05/2017 12:06 PM  Site Assessment Clean;Dry;Intact 01/05/2017 12:06 PM  Lumen #1 Status Flushed;Saline locked;Blood return noted 01/05/2017 12:06 PM  Lumen #2 Status Flushed;Saline locked;Blood return noted 01/05/2017 12:06 PM  Lumen #3 Status Flushed;Saline locked;Blood return noted 01/05/2017 12:06 PM  Dressing Type Transparent 01/05/2017 12:06 PM  Dressing Status Clean;Dry;Intact;Antimicrobial disc in place 01/05/2017 12:06 PM  Line Care Connections checked and tightened 01/05/2017 12:06 PM  Line Adjustment (NICU/IV Team Only) No 01/05/2017 12:06 PM  Dressing Intervention New dressing 01/05/2017 12:06 PM  Dressing Change Due  01/12/17 01/05/2017 12:06 PM       Elliot Dallyiggs, Debbi Strandberg Wright 01/05/2017, 12:07 PM

## 2017-01-05 NOTE — Progress Notes (Signed)
Patient ID: Sherri Hale, female   DOB: Oct 19, 1950, 66 y.o.   MRN: [id number] Vital signs show blood pressure being maintained at 140 on Cardene drip Patient is requiring moderate sedation to maintain ventilatory control Very little motion in extremities Day 2 status post coiling of anterior communicating artery aneurysm.

## 2017-01-05 NOTE — Progress Notes (Signed)
eLink Physician-Brief Progress Note Patient Name: Sherri Hale DOB: 1950-08-21 MRN: [id number]   Date of Service  01/05/2017  HPI/Events of Note  Hypophosphatemia and potassium of 3.7  eICU Interventions  Phos and potassium replaced     Intervention Category Intermediate Interventions: Electrolyte abnormality - evaluation and management  Cleavon Goldman 01/05/2017, 5:46 AM

## 2017-01-05 NOTE — Progress Notes (Signed)
STROKE TEAM PROGRESS NOTE   SUBJECTIVE (INTERVAL HISTORY) No family member is at bedside. Pt still intubated on propofol, not following commands. Dr. Conchita Paris had ACOM aneurysm coiling 01/02/2017 Neuro slightly improved without posturing on pain stimulation.    OBJECTIVE Temp:  [97.9 F (36.6 C)-99.9 F (37.7 C)] 99 F (37.2 C) (06/09 0800) Pulse Rate:  [61-88] 84 (06/09 1000) Cardiac Rhythm: Normal sinus rhythm (06/09 0800) Resp:  [20-41] 23 (06/09 1000) BP: (118-171)/(46-84) 118/84 (06/09 1000) SpO2:  [91 %-99 %] 94 % (06/09 1000) Arterial Line BP: (124-171)/(45-63) 146/63 (06/09 1000) FiO2 (%):  [40 %] 40 % (06/09 0821) Weight:  [177 lb 7.5 oz (80.5 kg)] 177 lb 7.5 oz (80.5 kg) (06/09 0425)  CBC:  Recent Labs Lab 01/04/2017 1810  01/04/17 0556 01/05/17 0435  WBC 12.9*  < > 12.0* 12.9*  NEUTROABS 5.3  --   --   --   HGB 11.9*  < > 11.1* 10.9*  HCT 36.0  < > 33.6* 34.1*  MCV 85.9  < > 86.2 88.6  PLT 236  < > 207 228  < > = values in this interval not displayed.  Basic Metabolic Panel:   Recent Labs Lab 01/04/17 0556 01/04/17 1638 01/05/17 0435  NA 144  --  147*  K 3.0*  --  3.7  CL 118*  --  127*  CO2 17*  --  13*  GLUCOSE 230*  --  303*  BUN 11  --  26*  CREATININE 0.85  --  1.13*  CALCIUM 7.5*  --  7.7*  MG  --  2.1 2.1  PHOS  --  2.0* 2.1*    Lipid Panel:     Component Value Date/Time   TRIG 279 (H) 01/24/2017 2225   HgbA1c: No results found for: HGBA1C Urine Drug Screen:     Component Value Date/Time   LABOPIA NONE DETECTED 01/06/2017 1821   COCAINSCRNUR NONE DETECTED 01/19/2017 1821   LABBENZ NONE DETECTED 01/12/2017 1821   AMPHETMU NONE DETECTED 01/19/2017 1821   THCU NONE DETECTED 12/28/2016 1821   LABBARB NONE DETECTED 01/12/2017 1821    Alcohol Level     Component Value Date/Time   ETH <5 12/29/2016 1810    IMAGING I have personally reviewed the radiological images below and agree with the radiology interpretations.  Ct Head Wo  Contrast 01/26/2017 1. Massive subarachnoid hemorrhage over both convexities and within the sylvian fissures, basal cisterns and the lateral and third ventricles. Given the distribution, an aneurysmal etiology is favored, though other entities may also cause this distribution. However, it is somewhat unclear whether there may be a component of intraparenchymal hemorrhage of the inferomedial frontal lobe. CTA of the head is recommended. 2. Effacement of the basal cisterns and crowding of the foramen magnum with early/impending herniation of the cerebellar tonsils.   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 No aneurysm filling is seen post-embolization.   TTE - Left ventricle: The cavity size was normal. There was moderate   focal basal hypertrophy. Systolic function was vigorous. The   estimated ejection fraction was in the range of 65% to 70%. Wall   motion was normal; there were no regional wall motion   abnormalities. Left ventricular diastolic function parameters   were normal. - Aortic valve: Poorly visualized. Valve area (VTI): 1.91 cm^2.   Valve area (Vmax): 1.83 cm^2. Valve area (Vmean): 1.9 cm^2.  EEG -  This is an abnormal EEG due to moderate diffuse generalized slowing with poor reactivity and disorganization of the background. No seizures. There is a greater degree of slowing in the right frontal region suggesting more localized cerebral dysfunction in that area. Clinical correlation: This study is consistent with moderate global cerebral dysfunction with possibly greater dysfunction in the right frontal region. No evidence of seizures. Clinical correlation is recommended.  TCD no vasospasm but limited study and the patient and PHYSICAL EXAM  Temp:  [97.9 F (36.6 C)-99.9 F (37.7 C)] 99 F (37.2 C) (06/09 0800) Pulse Rate:  [61-88] 84 (06/09 1000) Resp:   [20-41] 23 (06/09 1000) BP: (118-171)/(46-84) 118/84 (06/09 1000) SpO2:  [91 %-99 %] 94 % (06/09 1000) Arterial Line BP: (124-171)/(45-63) 146/63 (06/09 1000) FiO2 (%):  [40 %] 40 % (06/09 0821) Weight:  [177 lb 7.5 oz (80.5 kg)] 177 lb 7.5 oz (80.5 kg) (06/09 0425)  General - Well nourished, well developed, intubated on sedation.  Ophthalmologic - Fundi not visualized due to small pupils.  Cardiovascular - Regular rate and rhythm.  Neuro - intubated on sedation, not open eyes or following commands. Eyes middle position, conjugate, but slight doll's eyes on head moving. Pupils 2mm equal round, but very sluggish to light. Weak corneals but positive gag. Breathing over the vent. On pain stimulation,  able to withdraw in all extremities with LUE more allmost purposeful movement than other limbs mild withdraw on BLEs. Increased tone bilaterally, with bilateral babinski. Sensation, coordination and gait not tested.   ASSESSMENT/PLAN Ms. Thom Chimeslicia Correa Basto is a 66 y.o. female with history of HTN presenting with severe HA which progressed to unresponsive state, seizure activity and vomiting. CT showed a extensive SAH.   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 Effacement of the basal cisterns with early/impending cerebellar herniation  Cerebral angio successfully coiled ruptured ACOM aneurysm  NSG on board  TCD pending  TTE EF 65-70%  EEG diffuse slowing and no seizure  SCDs for VTE prophylaxis Diet NPO time specified  No antithrombotic prior to admission  Nimodipine started 01/24/2017  Ongoing aggressive stroke risk factor management  Therapy recommendations:  pending   Disposition:  Pending   Respiratory failure  [facility]  Intubated in ED  managemed by CCM  Hypertensive Emergency  SBP 300 palp on scene  On cardene for BP control  SBP goal <  160   Currently BP at goal.    ? Seizure - more likely stimulation induced decorticate posturing with unsustained LUE myoclonus, resolved on today's exam  High risk for seizure  on Keppra 1000mg  bid  EEG diffuse slowing but no seizure  Seizure precuation  Other Stroke Risk Factors  Advanced age  Other Active Problems  hypocalcemia  Hypokalemia  Hospital day # 3  This patient is critically ill due to extensive SAH and coma with posturing and at significant risk of neurological worsening, death form recurrent hemorrhage, hydrocephalus, seizure, and vasospasm. This patient's care requires constant monitoring of vital signs, hemodynamics, respiratory and cardiac monitoring, review of multiple databases, neurological assessment, discussion with family, other specialists and medical decision making of high complexity. I spent 32 minutes of neurocritical care time in the care of this patient.  June Leapramod Joselle Deeds,MD Stroke Neurology 01/05/2017 11:31 AM  To contact Stroke Continuity provider, please refer to [email or web address]. After hours, contact General Neurology

## 2017-01-06 ENCOUNTER — Inpatient Hospital Stay (HOSPITAL_COMMUNITY): Payer: Medicare HMO

## 2017-01-06 DIAGNOSIS — J690 Pneumonitis due to inhalation of food and vomit: Secondary | ICD-10-CM

## 2017-01-06 LAB — CBC
HCT: 32.9 % — ABNORMAL LOW (ref 36.0–46.0)
HEMOGLOBIN: 10.4 g/dL — AB (ref 12.0–15.0)
MCH: 28.1 pg (ref 26.0–34.0)
MCHC: 31.6 g/dL (ref 30.0–36.0)
MCV: 88.9 fL (ref 78.0–100.0)
Platelets: 213 10*3/uL (ref 150–400)
RBC: 3.7 MIL/uL — AB (ref 3.87–5.11)
RDW: 14.4 % (ref 11.5–15.5)
WBC: 9.4 10*3/uL (ref 4.0–10.5)

## 2017-01-06 LAB — TRIGLYCERIDES: Triglycerides: 414 mg/dL — ABNORMAL HIGH (ref <150)

## 2017-01-06 LAB — BASIC METABOLIC PANEL
ANION GAP: 10 (ref 5–15)
BUN: 35 mg/dL — ABNORMAL HIGH (ref 6–20)
CALCIUM: 8.2 mg/dL — AB (ref 8.9–10.3)
CO2: 11 mmol/L — AB (ref 22–32)
Chloride: 129 mmol/L — ABNORMAL HIGH (ref 101–111)
Creatinine, Ser: 1.02 mg/dL — ABNORMAL HIGH (ref 0.44–1.00)
GFR, EST NON AFRICAN AMERICAN: 56 mL/min — AB (ref >60)
Glucose, Bld: 132 mg/dL — ABNORMAL HIGH (ref 65–99)
Potassium: 3.5 mmol/L (ref 3.5–5.1)
Sodium: 150 mmol/L — ABNORMAL HIGH (ref 135–145)

## 2017-01-06 LAB — GLUCOSE, CAPILLARY
GLUCOSE-CAPILLARY: 132 mg/dL — AB (ref 65–99)
GLUCOSE-CAPILLARY: 259 mg/dL — AB (ref 65–99)
GLUCOSE-CAPILLARY: 208 mg/dL — AB (ref 65–99)
Glucose-Capillary: 122 mg/dL — ABNORMAL HIGH (ref 65–99)
Glucose-Capillary: 160 mg/dL — ABNORMAL HIGH (ref 65–99)
Glucose-Capillary: 166 mg/dL — ABNORMAL HIGH (ref 65–99)
Glucose-Capillary: 253 mg/dL — ABNORMAL HIGH (ref 65–99)

## 2017-01-06 MED ORDER — POTASSIUM CHLORIDE 20 MEQ/15ML (10%) PO SOLN
40.0000 meq | Freq: Once | ORAL | Status: AC
  Administered 2017-01-06: 40 meq
  Filled 2017-01-06: qty 30

## 2017-01-06 MED ORDER — LABETALOL HCL 5 MG/ML IV SOLN
10.0000 mg | INTRAVENOUS | Status: DC | PRN
  Administered 2017-01-07: 10 mg via INTRAVENOUS
  Filled 2017-01-06 (×2): qty 4

## 2017-01-06 MED ORDER — FENTANYL CITRATE (PF) 100 MCG/2ML IJ SOLN
50.0000 ug | INTRAMUSCULAR | Status: DC | PRN
  Administered 2017-01-07: 100 ug via INTRAVENOUS
  Administered 2017-01-07: 50 ug via INTRAVENOUS
  Administered 2017-01-07 (×3): 100 ug via INTRAVENOUS
  Administered 2017-01-07: 50 ug via INTRAVENOUS
  Administered 2017-01-08: 100 ug via INTRAVENOUS
  Administered 2017-01-08: 50 ug via INTRAVENOUS
  Administered 2017-01-08 – 2017-01-10 (×10): 100 ug via INTRAVENOUS
  Administered 2017-01-10 – 2017-01-12 (×2): 50 ug via INTRAVENOUS
  Filled 2017-01-06 (×19): qty 2

## 2017-01-06 MED ORDER — LOPERAMIDE HCL 1 MG/5ML PO LIQD
2.0000 mg | ORAL | Status: DC | PRN
  Administered 2017-01-10: 2 mg
  Filled 2017-01-06: qty 10

## 2017-01-06 MED ORDER — LOSARTAN POTASSIUM 50 MG PO TABS
100.0000 mg | ORAL_TABLET | Freq: Every day | ORAL | Status: DC
  Administered 2017-01-07 – 2017-01-12 (×6): 100 mg
  Filled 2017-01-06 (×6): qty 2

## 2017-01-06 MED ORDER — POTASSIUM CHLORIDE 20 MEQ/15ML (10%) PO SOLN
40.0000 meq | Freq: Once | ORAL | Status: AC
Start: 2017-01-06 — End: 2017-01-06
  Administered 2017-01-06: 40 meq
  Filled 2017-01-06: qty 30

## 2017-01-06 MED ORDER — FUROSEMIDE 10 MG/ML IJ SOLN
40.0000 mg | Freq: Once | INTRAMUSCULAR | Status: AC
  Administered 2017-01-06: 40 mg via INTRAVENOUS
  Filled 2017-01-06: qty 4

## 2017-01-06 NOTE — Progress Notes (Signed)
PULMONARY / CRITICAL CARE MEDICINE   Name: Thom Chimeslicia Correa Basto MRN: [id number] DOB: 1951/04/27    ADMISSION DATE:  01/14/2017 CONSULTATION DATE:  6/6  REFERRING MD:  Dr. Verdie MosherLiu EDP  CHIEF COMPLAINT:  SAH  BRIEF SUMMARY:  66 year old female with past medical history of hypertension who [facility] she was complaining of severe headache and then suddenly became unresponsive. She developed altered mental status and vomited.  Intubated for airway protection.  Found to be profoundly hypertensive with systolic blood pressures in the 230s. In the emergency department she was started on nicardipine infusion and loaded with IV Keppra. She was taken for a CT scan of the head which showed extensive subarachnoid hemorrhage. She was evaluated by Neurosurgery.   Ultimately went to Neuro IR for coiling.    SUBJECTIVE:  RT reports pt weaning on PSV, increased secretions.  RN reports increased swelling, TF started & pt developed diarrhea.    VITAL SIGNS: Temp:  [97.8 F (36.6 C)-99.2 F (37.3 C)] 98.5 F (36.9 C) (06/10 0729) Pulse Rate:  [60-87] 62 (06/10 0700) Resp:  [19-43] 25 (06/10 0700) BP: (118-146)/(47-84) 146/59 (06/10 0800) SpO2:  [93 %-100 %] 95 % (06/10 0700) Arterial Line BP: (128-172)/(52-116) 144/59 (06/10 0700) FiO2 (%):  [40 %] 40 % (06/10 0805) Weight:  [186 lb 1.1 oz (84.4 kg)] 186 lb 1.1 oz (84.4 kg) (06/10 0400)  HEMODYNAMICS:    VENTILATOR SETTINGS: Vent Mode: CPAP;PSV FiO2 (%):  [40 %] 40 % Set Rate:  [20 bmp] 20 bmp Vt Set:  [490 mL] 490 mL PEEP:  [5 cmH20] 5 cmH20 Pressure Support:  [5 cmH20] 5 cmH20 Plateau Pressure:  [18 cmH20-30 cmH20] 24 cmH20  INTAKE / OUTPUT:  Intake/Output Summary (Last 24 hours) at 01/06/17 0815 Last data filed at 01/06/17 0700  Gross per 24 hour  Intake          5599.08 ml  Output                1 ml  Net          5598.08 ml      PHYSICAL EXAMINATION: General: critically ill appearing female on vent in NAD  HEENT: MM pink/moist, ETT Neuro: obtunded, no response  CV: s1s2 rrr, no m/r/g PULM: even/non-labored, lungs bilaterally coarse rhonchi  WU:JWJXGI:soft, non-tender, bsx4 active  Extremities: warm/dry, 3+ generalized edema / anasarca  Skin: no rashes or lesions  LABS:  BMET  Recent Labs Lab 01/04/17 0556 01/05/17 0435 01/06/17 0404  NA 144 147* 150*  K 3.0* 3.7 3.5  CL 118* 127* 129*  CO2 17* 13* 11*  BUN 11 26* 35*  CREATININE 0.85 1.13* 1.02*  GLUCOSE 230* 303* 132*    Electrolytes  Recent Labs Lab 01/04/17 0556 01/04/17 1638 01/05/17 0435 01/05/17 1950 01/06/17 0404  CALCIUM 7.5*  --  7.7*  --  8.2*  MG  --  2.1 2.1 2.0  --   PHOS  --  2.0* 2.1* 4.3  --     CBC  Recent Labs Lab 01/04/17 0556 01/05/17 0435 01/06/17 0404  WBC 12.0* 12.9* 9.4  HGB 11.1* 10.9* 10.4*  HCT 33.6* 34.1* 32.9*  PLT 207 228 213    Coag's  Recent Labs Lab 01/21/2017 1810  APTT 34  INR 0.97    Sepsis Markers No results for input(s): LATICACIDVEN, PROCALCITON, O2SATVEN in the last 168 hours.  ABG  Recent Labs Lab Jan 30, 2017 1931 01/05/2017 0350 01/05/17 0408  PHART 7.471* 7.441 7.299*  PCO2ART 34.7 32.3 23.7*  PO2ART 357.0* 137* 74.2*    Liver Enzymes  Recent Labs Lab 2017/01/30 1810 01/04/17 0556  AST 67* 39  ALT 64* 67*  ALKPHOS 107 94  BILITOT 0.8 1.4*  ALBUMIN 3.7 3.3*    Cardiac Enzymes No results for input(s): TROPONINI, PROBNP in the last 168 hours.  Glucose  Recent Labs Lab 01/05/17 1557 01/05/17 1949 01/05/17 2334 01/06/17 0401 01/06/17 0403 01/06/17 0728  GLUCAP 295* 269* 162* 122* 132* 160*    Imaging Dg Chest Port 1 View  Result Date: 01/06/2017 CLINICAL DATA:  Hypoxia EXAM: PORTABLE CHEST 1 VIEW COMPARISON:  January 05, 2017 FINDINGS: Endotracheal tube tip is 3.4 cm above the carina. Nasogastric tube tip and side port below the diaphragm. Pacemaker leads  are unchanged in position. No pneumothorax. There is patchy airspace consolidation in the medial bases bilaterally as well as more peripherally in the right lower lobe. Lungs elsewhere clear. Heart size and pulmonary vascularity are normal. No adenopathy. No bone lesions. There is aortic atherosclerosis. IMPRESSION: Tube positions as described. No evident pneumothorax. Bibasilar consolidation as well as patchy infiltrate in the right lower lobe. Lungs elsewhere clear. Stable cardiac silhouette. There is aortic atherosclerosis. Electronically Signed   By: Bretta Bang III M.D.   On: 01/06/2017 07:27     STUDIES:  6/6 CT head >> Massive subarachnoid hemorrhage over both convexities and within the sylvian fissures, basal cisterns and the lateral and third ventricles. Given the distribution, an aneurysmal etiology is favored, though other entities may also cause this distribution. However, it is somewhat unclear whether there may be a component of intraparenchymal hemorrhage of the inferomedial frontal lobe. CTA of the head is recommended. Effacement of the basal cisterns and crowding of the foramen magnum with early/impending herniation of the cerebellar tonsils. 6/  EEG >> diffuse slowing, no seizure  CULTURES: MRSA 6/6 >> Neg  ANTIBIOTICS: Unasyn 6/10 >>   SIGNIFICANT EVENTS: 6/06  Admit 6/07  ACOM aneurysm coiling  6/09  Remains intubated, abx started for increased secretions   LINES/TUBES: ETT 6/6 >> L Rad Aline 6/6 >>  RUE PICC 6/9 >>   DISCUSSION: SAH w/ inability to protect airway. 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 Seizures - felt to be related to stimulation induced decorticate posturing, resolved P:   RASS goal: 0 to -1  Neurology + NSGY following, appreciate input  Propofol for sedation  Keppra, nimodipine  Further imaging per Neurology  SBP goal <160   PULMONARY A: Airway  compromise secondary to SAH (grade IV) P:   PRVC 8 cc/kg Wean PEEP / FiO2 for sats > 92% Lasix 40 mg IV x1, discussed with Neuro  Intermittent CXR  Neuro status prohibits weaning   CARDIOVASCULAR A:  Hypertensive emergency P:  SBP goal <160  ICU monitoring Cardene gtt for BP goals Continue losartan, HCTZ PT Nimodipine per Neurology  RENAL A:   Fluid and electrolyte imbalance Hypokalemia  Cannot see if urine osm sent? P:  Trend BMP / urinary output Replace electrolytes as indicated, KCL 6/10 Avoid nephrotoxic agents, ensure adequate renal perfusion KVO IVF   GASTROINTESTINAL A:   At Risk Protein Calorie Malnutrition  Diarrhea - started same day of TF, ABX started for aspiration on same day (doubt infectious) P:   NPO / OGT  TF per Nutrition  PPI for SUP   HEMATOLOGIC A:   Anemia No Obvious S/S bleeding P:  Trend CBC  SCD's for DVT prophylaxis   INFECTIOUS A:   Possible aspiration P:   Follow fever curve / WBC trend  ABX as above  ENDOCRINE A:   Hyperglycemia P:   CBG with SSI  Lantus 10 units  QD  FAMILY  - Updates: No family available on am NP rounds 6/10   - Inter-disciplinary family meet or Palliative Care meeting due by:  6/12  CC Time:  30 minutes   Canary Brim, NP-C [facility] Pgr: [phone] or if no answer [phone] 01/06/2017, 8:15 AM  Attending Note:  I have examined patient, reviewed labs, studies and notes. I have discussed the case with B Ollis, and I agree with the data and plans as amended above. 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 She's been on nicardipine for blood pressure control yet has also been on aggressive IV fluids presumably to avoid vasospasm. She is obtunded on exam (on sedation), has coarse breath sounds without wheezing, borderline hypertension, tachycardic without a murmur.  I believe she is profoundly volume overloaded, has significant edema upper and lower extremities. We will continue the nicardipine, KVO her IV fluids, initiate diuresis .we discussed this plan with neurology. Prognosis here appears to be poor. Empiric antibiotics were started 6/9 for suspected aspiration pneumonia. Continue the same for now.  Independent critical care time is 32 minutes.   Levy Pupa, MD, PhD 01/06/2017, 12:41 PM [facility] [phone] or if no answer [phone]

## 2017-01-06 NOTE — Progress Notes (Signed)
eLink Physician-Brief Progress Note Patient Name: Thom Chimeslicia Correa Basto DOB: 1951/02/05 MRN: [id number]   Date of Service  01/06/2017  HPI/Events of Note  Needs freq fent IV pushes On max cardene gtt  eICU Interventions  Increase losartan 100 Labetalol prn fent 50-100 q 2, use fent gtt if needed      Intervention Category Major Interventions: Delirium, psychosis, severe agitation - evaluation and management;Hypertension - evaluation and management  ALVA,RAKESH V. 01/06/2017, 11:46 PM

## 2017-01-06 NOTE — Progress Notes (Signed)
STROKE TEAM PROGRESS NOTE   SUBJECTIVE (INTERVAL HISTORY) Her daughter arrived during rounds at bedside. Pt still intubated on propofol, not following commands. .She goes into respiratory distress when sedation is weaned off  OBJECTIVE Temp:  [97.8 F (36.6 C)-99.2 F (37.3 C)] 98.6 F (37 C) (06/10 1121) Pulse Rate:  [60-87] 62 (06/10 0700) Resp:  [19-43] 25 (06/10 0700) BP: (115-146)/(47-68) 127/53 (06/10 1144) SpO2:  [92 %-100 %] 92 % (06/10 1112) Arterial Line BP: (128-172)/(52-116) 144/59 (06/10 0700) FiO2 (%):  [40 %] 40 % (06/10 1112) Weight:  [186 lb 1.1 oz (84.4 kg)] 186 lb 1.1 oz (84.4 kg) (06/10 0400)  CBC:  Recent Labs Lab 01/15/2017 1810  01/05/17 0435 01/06/17 0404  WBC 12.9*  < > 12.9* 9.4  NEUTROABS 5.3  --   --   --   HGB 11.9*  < > 10.9* 10.4*  HCT 36.0  < > 34.1* 32.9*  MCV 85.9  < > 88.6 88.9  PLT 236  < > 228 213  < > = values in this interval not displayed.  Basic Metabolic Panel:   Recent Labs Lab 01/05/17 0435 01/05/17 1950 01/06/17 0404  NA 147*  --  150*  K 3.7  --  3.5  CL 127*  --  129*  CO2 13*  --  11*  GLUCOSE 303*  --  132*  BUN 26*  --  35*  CREATININE 1.13*  --  1.02*  CALCIUM 7.7*  --  8.2*  MG 2.1 2.0  --   PHOS 2.1* 4.3  --     Lipid Panel:     Component Value Date/Time   TRIG 414 (H) 01/05/2017 1950   HgbA1c: No results found for: HGBA1C Urine Drug Screen:     Component Value Date/Time   LABOPIA NONE DETECTED 01/06/2017 1821   COCAINSCRNUR NONE DETECTED 12/28/2016 1821   LABBENZ NONE DETECTED 01/01/2017 1821   AMPHETMU NONE DETECTED 01/07/2017 1821   THCU NONE DETECTED 01/24/2017 1821   LABBARB NONE DETECTED 01/11/2017 1821    Alcohol Level     Component Value Date/Time   ETH <5 01/15/2017 1810    IMAGING I have personally reviewed the radiological images below and agree with the radiology interpretations.  Ct Head Wo Contrast 01/06/2017 1. Massive subarachnoid hemorrhage over both convexities and within  the sylvian fissures, basal cisterns and the lateral and third ventricles. Given the distribution, an aneurysmal etiology is favored, though other entities may also cause this distribution. However, it is somewhat unclear whether there may be a component of intraparenchymal hemorrhage of the inferomedial frontal lobe. CTA of the head is recommended. 2. Effacement of the basal cisterns and crowding of the foramen magnum with early/impending herniation of the cerebellar tonsils.   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 No aneurysm filling is seen post-embolization.   TTE - Left ventricle: The cavity size was normal. There was moderate   focal basal hypertrophy. Systolic function was vigorous. The   estimated ejection fraction was in the range of 65% to 70%. Wall   motion was normal; there were no regional wall motion   abnormalities. Left ventricular diastolic function parameters   were normal. - Aortic valve: Poorly visualized. Valve area (VTI): 1.91 cm^2.   Valve area (Vmax): 1.83 cm^2. Valve area (Vmean): 1.9 cm^2.  EEG - This is an abnormal EEG due to moderate diffuse generalized slowing with poor  reactivity and disorganization of the background. No seizures. There is a greater degree of slowing in the right frontal region suggesting more localized cerebral dysfunction in that area. Clinical correlation: This study is consistent with moderate global cerebral dysfunction with possibly greater dysfunction in the right frontal region. No evidence of seizures. Clinical correlation is recommended.  TCD no vasospasm but limited study   PHYSICAL EXAM  Temp:  [97.8 F (36.6 C)-99.2 F (37.3 C)] 98.6 F (37 C) (06/10 1121) Pulse Rate:  [60-87] 62 (06/10 0700) Resp:  [19-43] 25 (06/10 0700) BP: (115-146)/(47-68) 127/53 (06/10 1144) SpO2:  [92 %-100 %] 92 % (06/10  1112) Arterial Line BP: (128-172)/(52-116) 144/59 (06/10 0700) FiO2 (%):  [40 %] 40 % (06/10 1112) Weight:  [186 lb 1.1 oz (84.4 kg)] 186 lb 1.1 oz (84.4 kg) (06/10 0400)  General - Well nourished, well developed, intubated on sedation.  Ophthalmologic - Fundi not visualized due to small pupils.  Cardiovascular - Regular rate and rhythm. 2 + edema extremities  Neuro - intubated on sedation, not open eyes or following commands. Eyes middle position, conjugate, but slight doll's eyes on head moving. Pupils 2mm equal round, but very sluggish to light. weak corneals but positive gag. Breathing over the vent. On pain stimulation,  able to withdraw in all extremities with LUE more allmost purposeful movement than other limbs mild withdraw on BLEs. Increased tone bilaterally, with bilateral babinski. Sensation, coordination and gait not tested.   ASSESSMENT/PLAN [name] is a 66 y.o. female with history of HTN presenting with severe HA which progressed to unresponsive state, seizure activity and vomiting. CT showed a extensive SAH.   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 Effacement of the basal cisterns with early/impending cerebellar herniation  Cerebral angio successfully coiled ruptured ACOM aneurysm  NSG on board  TCD pending  TTE EF 65-70%  EEG diffuse slowing and no seizure  SCDs for VTE prophylaxis Diet NPO time specified  No antithrombotic prior to admission  Nimodipine started 01/23/2017  Ongoing aggressive stroke risk factor management  Therapy recommendations:  pending   Disposition:  Pending   Respiratory failure  Secondary to [facility]  Intubated in ED  managemed by CCM  Hypertensive Emergency  SBP 300 palp on scene  On cardene for BP control  SBP goal < 160   Currently BP at goal.    ? Seizure - more likely stimulation  induced decorticate posturing with unsustained LUE myoclonus, resolved on today's exam  High risk for seizure  on Keppra 1000mg  bid  EEG diffuse slowing but no seizure  Seizure precuation  Other Stroke Risk Factors  Advanced age  Other Active Problems  hypocalcemia  Hypokalemia  Hospital day # 4  This patient is critically ill due to extensive SAH and coma with posturing and at significant risk of neurological worsening, death form recurrent hemorrhage, hydrocephalus, seizure, and vasospasm. This patient's care requires constant monitoring of vital signs, hemodynamics, respiratory and cardiac monitoring, review of multiple databases, neurological assessment, discussion with family, other specialists and medical decision making of high complexity. I spent 35 minutes of neurocritical care time in the care of this patient. Discussed with critical care nurse practitioner Antonieta Loveless. Agree with discontinuing IV fluids and use Lasix as she is appears to be volume overloaded. Continue oral blood pressure medications through NG tube and wean  Cardene drip as tolerated. Prognosis is guarded given poor neurological exam. I had a long discussion with the patient's daughter at the bedside and answered questions about her prognosis.  June Leap Stroke Neurology 01/06/2017 11:55 AM  To contact Stroke Continuity provider, please refer to [email or web address]. After hours, contact General Neurology

## 2017-01-06 NOTE — Progress Notes (Signed)
Patient ID: Sherri Hale, female   DOB: 12-23-1950, 66 y.o.   MRN: [id number] Vital signs demonstrate hypertension still requiring Cardene drip Patient has significant diffuse peripheral edema and particularly edema of the tongue She is receiving some diuretics at the current time Continue supportive care Full ventilatory support

## 2017-01-07 ENCOUNTER — Inpatient Hospital Stay (HOSPITAL_COMMUNITY): Payer: Medicare HMO

## 2017-01-07 DIAGNOSIS — I609 Nontraumatic subarachnoid hemorrhage, unspecified: Secondary | ICD-10-CM

## 2017-01-07 DIAGNOSIS — G9340 Encephalopathy, unspecified: Secondary | ICD-10-CM

## 2017-01-07 DIAGNOSIS — I161 Hypertensive emergency: Secondary | ICD-10-CM

## 2017-01-07 LAB — BLOOD GAS, ARTERIAL
Acid-base deficit: 14 mmol/L — ABNORMAL HIGH (ref 0.0–2.0)
Acid-base deficit: 14.6 mmol/L — ABNORMAL HIGH (ref 0.0–2.0)
Bicarbonate: 11.3 mmol/L — ABNORMAL LOW (ref 20.0–28.0)
Bicarbonate: 11.1 mmol/L — ABNORMAL LOW (ref 20.0–28.0)
Drawn by: 41977
Drawn by: 39899
FIO2: 0.4
FIO2: 40
LHR: 20 {breaths}/min
MECHVT: 490 mL
O2 SAT: 93.7 %
O2 Saturation: 97 %
PATIENT TEMPERATURE: 98.6
PATIENT TEMPERATURE: 98.6
PCO2 ART: 23.7 mmHg — AB (ref 32.0–48.0)
PCO2 ART: 25.1 mmHg — AB (ref 32.0–48.0)
PEEP: 5 cmH2O
PEEP: 5 cmH2O
PH ART: 7.299 — AB (ref 7.350–7.450)
PO2 ART: 74.2 mmHg — AB (ref 83.0–108.0)
PO2 ART: 100 mmHg (ref 83.0–108.0)
RATE: 20 resp/min
VT: 490 mL
pH, Arterial: 7.267 — ABNORMAL LOW (ref 7.350–7.450)

## 2017-01-07 LAB — BASIC METABOLIC PANEL
BUN: 42 mg/dL — ABNORMAL HIGH (ref 6–20)
CALCIUM: 8.6 mg/dL — AB (ref 8.9–10.3)
CO2: 12 mmol/L — AB (ref 22–32)
CREATININE: 1.12 mg/dL — AB (ref 0.44–1.00)
Chloride: >130 mmol/L (ref 101–111)
GFR, EST AFRICAN AMERICAN: 58 mL/min — AB (ref >60)
GFR, EST NON AFRICAN AMERICAN: 50 mL/min — AB (ref >60)
Glucose, Bld: 183 mg/dL — ABNORMAL HIGH (ref 65–99)
Potassium: 3.6 mmol/L (ref 3.5–5.1)
SODIUM: 154 mmol/L — AB (ref 135–145)

## 2017-01-07 LAB — GLUCOSE, CAPILLARY
GLUCOSE-CAPILLARY: 158 mg/dL — AB (ref 65–99)
GLUCOSE-CAPILLARY: 233 mg/dL — AB (ref 65–99)
GLUCOSE-CAPILLARY: 181 mg/dL — AB (ref 65–99)
GLUCOSE-CAPILLARY: 133 mg/dL — AB (ref 65–99)
Glucose-Capillary: 148 mg/dL — ABNORMAL HIGH (ref 65–99)
Glucose-Capillary: 233 mg/dL — ABNORMAL HIGH (ref 65–99)

## 2017-01-07 LAB — CBC
HCT: 34.7 % — ABNORMAL LOW (ref 36.0–46.0)
Hemoglobin: 11 g/dL — ABNORMAL LOW (ref 12.0–15.0)
MCH: 28.1 pg (ref 26.0–34.0)
MCHC: 31.7 g/dL (ref 30.0–36.0)
MCV: 88.5 fL (ref 78.0–100.0)
PLATELETS: 244 10*3/uL (ref 150–400)
RBC: 3.92 MIL/uL (ref 3.87–5.11)
RDW: 14.7 % (ref 11.5–15.5)
WBC: 9.1 10*3/uL (ref 4.0–10.5)

## 2017-01-07 LAB — MAGNESIUM: MAGNESIUM: 2.4 mg/dL (ref 1.7–2.4)

## 2017-01-07 MED ORDER — DEXMEDETOMIDINE HCL IN NACL 400 MCG/100ML IV SOLN
0.0000 ug/kg/h | INTRAVENOUS | Status: DC
  Administered 2017-01-07 – 2017-01-08 (×2): 0.3 ug/kg/h via INTRAVENOUS
  Administered 2017-01-09: 0.6 ug/kg/h via INTRAVENOUS
  Administered 2017-01-09: 0.4 ug/kg/h via INTRAVENOUS
  Administered 2017-01-09: 0.6 ug/kg/h via INTRAVENOUS
  Administered 2017-01-10 – 2017-01-11 (×3): 0.5 ug/kg/h via INTRAVENOUS
  Administered 2017-01-11 – 2017-01-12 (×3): 0.4 ug/kg/h via INTRAVENOUS
  Filled 2017-01-07 (×11): qty 100

## 2017-01-07 MED ORDER — HYDROCHLOROTHIAZIDE 25 MG PO TABS
25.0000 mg | ORAL_TABLET | Freq: Every day | ORAL | Status: DC
  Administered 2017-01-07 – 2017-01-10 (×4): 25 mg
  Filled 2017-01-07 (×3): qty 1

## 2017-01-07 MED ORDER — SODIUM CHLORIDE 0.9 % IV SOLN
0.0000 ug/kg/h | INTRAVENOUS | Status: DC
  Administered 2017-01-07: 0.4 ug/kg/h via INTRAVENOUS
  Administered 2017-01-07: 0.6 ug/kg/h via INTRAVENOUS
  Administered 2017-01-07: 0.8 ug/kg/h via INTRAVENOUS
  Filled 2017-01-07 (×3): qty 2

## 2017-01-07 MED ORDER — NICARDIPINE HCL IN NACL 40-0.83 MG/200ML-% IV SOLN
3.0000 mg/h | INTRAVENOUS | Status: DC
  Administered 2017-01-07 – 2017-01-08 (×3): 15 mg/h via INTRAVENOUS
  Administered 2017-01-08: 10 mg/h via INTRAVENOUS
  Administered 2017-01-08 (×3): 15 mg/h via INTRAVENOUS
  Administered 2017-01-08: 10 mg/h via INTRAVENOUS
  Administered 2017-01-08: 15 mg/h via INTRAVENOUS
  Administered 2017-01-08: 12 mg/h via INTRAVENOUS
  Administered 2017-01-09 – 2017-01-11 (×20): 15 mg/h via INTRAVENOUS
  Administered 2017-01-11: 10 mg/h via INTRAVENOUS
  Administered 2017-01-11: 12.5 mg/h via INTRAVENOUS
  Administered 2017-01-12 – 2017-01-13 (×3): 3 mg/h via INTRAVENOUS
  Filled 2017-01-07 (×38): qty 200

## 2017-01-07 NOTE — Progress Notes (Signed)
PULMONARY / CRITICAL CARE MEDICINE   Name: Sherri Hale MRN: [id number] DOB: 12-31-1950    ADMISSION DATE:  01/16/2017 CONSULTATION DATE:  6/6  REFERRING MD:  Dr. Verdie Mosher EDP  CHIEF COMPLAINT:  SAH  BRIEF SUMMARY:  66 year old female with past medical history of hypertension who presented to [facility] emergency department 6/6 via EMS after collapsing at [facility]. While at the [facility] she was complaining of severe headache and then suddenly became unresponsive. She developed altered mental status and vomited.  Intubated for airway protection.  Found to be profoundly hypertensive with systolic blood pressures in the 230s. In the emergency department she was started on nicardipine infusion and loaded with IV Keppra. She was taken for a CT scan of the head which showed extensive subarachnoid hemorrhage. She was evaluated by Neurosurgery.   Ultimately went to Neuro IR for coiling.    SUBJECTIVE:  Severe tachypnea when off propofol  VITAL SIGNS: Temp:  [98.3 F (36.8 C)-98.9 F (37.2 C)] 98.4 F (36.9 C) (06/11 0827) Pulse Rate:  [59-92] 92 (06/11 0912) Resp:  [18-81] 40 (06/11 0912) BP: (109-170)/(50-63) 155/55 (06/11 0912) SpO2:  [87 %-99 %] 96 % (06/11 0900) Arterial Line BP: (115-173)/(44-68) 159/55 (06/11 0900) FiO2 (%):  [40 %] 40 % (06/11 0912) Weight:  [85.6 kg (188 lb 11.4 oz)] 85.6 kg (188 lb 11.4 oz) (06/11 0330)  HEMODYNAMICS:    VENTILATOR SETTINGS: Vent Mode: PRVC FiO2 (%):  [40 %] 40 % Set Rate:  [20 bmp] 20 bmp Vt Set:  [490 mL] 490 mL PEEP:  [5 cmH20] 5 cmH20 Plateau Pressure:  [26 cmH20-30 cmH20] 26 cmH20  INTAKE / OUTPUT:  Intake/Output Summary (Last 24 hours) at 01/07/17 1018 Last data filed at 01/07/17 0915  Gross per 24 hour  Intake          3604.44 ml  Output             3315 ml  Net           289.44 ml   PHYSICAL EXAMINATION: General: critically ill appearing female on vent in NAD with severe tachypnea off propofol and  unresponsive HEENT: [place]/AT, PERRL, EOM-I and MMM Neuro: Unresponsive, high RR and no withdraw to pain or command CV: RRR, Nl S1/S2, -M/R/G. PULM: High RR with coarse BS diffusely, needs suction GI: Soft, NT, ND and +BS Extremities: warm/dry, 3+ generalized edema / anasarca  Skin: no rashes or lesions  LABS:  BMET  Recent Labs Lab 01/05/17 0435 01/06/17 0404 01/07/17 0344  NA 147* 150* 154*  K 3.7 3.5 3.6  CL 127* 129* >130*  CO2 13* 11* 12*  BUN 26* 35* 42*  CREATININE 1.13* 1.02* 1.12*  GLUCOSE 303* 132* 183*   Electrolytes  Recent Labs Lab 01/04/17 1638 01/05/17 0435 01/05/17 1950 01/06/17 0404 01/07/17 0344  CALCIUM  --  7.7*  --  8.2* 8.6*  MG 2.1 2.1 2.0  --  2.4  PHOS 2.0* 2.1* 4.3  --   --     CBC  Recent Labs Lab 01/05/17 0435 01/06/17 0404 01/07/17 0344  WBC 12.9* 9.4 9.1  HGB 10.9* 10.4* 11.0*  HCT 34.1* 32.9* 34.7*  PLT 228 213 244    Coag's  Recent Labs Lab January 16, 2017 1810  APTT 34  INR 0.97    Sepsis Markers No results for input(s): LATICACIDVEN, PROCALCITON, O2SATVEN in the last 168 hours.  ABG  Recent Labs Lab 16-Jan-2017 1931 01/08/2017 0350 01/05/17 0408  PHART 7.471* 7.441  7.299*  PCO2ART 34.7 32.3 23.7*  PO2ART 357.0* 137* 74.2*    Liver Enzymes  Recent Labs Lab 10-24-2016 1810 01/04/17 0556  AST 67* 39  ALT 64* 67*  ALKPHOS 107 94  BILITOT 0.8 1.4*  ALBUMIN 3.7 3.3*    Cardiac Enzymes No results for input(s): TROPONINI, PROBNP in the last 168 hours.  Glucose  Recent Labs Lab 01/06/17 1123 01/06/17 1552 01/06/17 2058 01/06/17 2346 01/07/17 0343 01/07/17 0820  GLUCAP 253* 259* 208* 166* 158* 233*    Imaging Dg Chest Port 1 View  Result Date: 01/07/2017 CLINICAL DATA:  Acute respiratory failure with hypoxia. EXAM: PORTABLE CHEST 1 VIEW COMPARISON:  01/06/2017 FINDINGS: Endotracheal tube is 4.2 cm above the carina. Nasogastric tube extends into the abdomen. 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 There continues to be hazy densities in the lower chest bilaterally. This may represent a combination of pleural fluid and atelectasis/airspace disease. Heart size is within normal limits and stable. There is a right-sided central line with the tip in the lower SVC. Negative for a pneumothorax. IMPRESSION: Persistent bibasilar chest disease. Findings may represent a combination of airspace disease/ atelectasis and pleural fluid. Support apparatuses as described. Electronically Signed   By: Richarda OverlieAdam  Henn M.D.   On: 01/07/2017 07:37     STUDIES:  6/6 CT head >> Massive subarachnoid hemorrhage over both convexities and within the sylvian fissures, basal cisterns and the lateral and third ventricles. Given the distribution, an aneurysmal etiology is favored, though other entities may also cause this distribution. However, it is somewhat unclear whether there may be a component of intraparenchymal hemorrhage of the inferomedial frontal lobe. CTA of the head is recommended. Effacement of the basal cisterns and crowding of the foramen magnum with early/impending herniation of the cerebellar tonsils. 6/  EEG >> diffuse slowing, no seizure  CULTURES: MRSA 6/6 >> Neg  ANTIBIOTICS: Unasyn 6/10 >>   SIGNIFICANT EVENTS: 6/06  Admit 6/07  ACOM aneurysm coiling  6/09  Remains intubated, abx started for increased secretions   LINES/TUBES: ETT 6/6 >> L Rad Aline 6/6 >>  RUE PICC 6/9 >>   DISCUSSION: SAH w/ inability to protect airway. 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 Seizures - felt to be related to stimulation induced decorticate posturing, resolved P:   RASS goal: 0 to -1  Neurology + NSGY following, appreciate input, grim prognosis Propofol for sedation only being used for increased RR (neurogenic breathing). 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 P:  Trend BMP / urinary output Replace electrolytes as indicated, KCL 6/10 Avoid nephrotoxic agents, ensure adequate renal perfusion KVO IVF   GASTROINTESTINAL A:   At Risk Protein Calorie Malnutrition  Diarrhea - started same day of TF, ABX started  for aspiration on same day (doubt infectious) P:   NPO / OGT  TF per Nutrition  PPI for SUP   HEMATOLOGIC A:   Anemia No Obvious S/S bleeding P:  Trend CBC  SCD's for DVT prophylaxis   INFECTIOUS A:   Possible aspiration with very purulent appearing sputum on suctioning P:   Follow fever curve / WBC trend  ABX as above - unasyn F/U on cultures  ENDOCRINE A:   Hyperglycemia P:   CBG with SSI  Lantus 10 units  QD  FAMILY  - Updates: No family available on AM rounds, RN communicating with family to gather for a meeting.  Not a candidate for trach (futile care with a very poor prognosis per neurology and neurosurgery).  - Inter-disciplinary family meet or Palliative Care meeting due by:  6/12  The patient is critically ill with multiple organ systems failure and requires high complexity decision making for assessment and support, frequent evaluation and titration of therapies, application of advanced monitoring technologies and extensive interpretation of multiple databases.   Critical Care Time devoted to patient care services described in  this note is  35  Minutes. This time reflects time of care of this signee Dr Koren Bound. This critical care time does not reflect procedure time, or teaching time or supervisory time of PA/NP/Med student/Med Resident etc but could involve care discussion time.  Alyson Reedy, M.D. [facility] Pulmonary/Critical Care Medicine. Pager: [phone]. After hours pager: [phone].

## 2017-01-07 NOTE — Progress Notes (Signed)
Transcranial Doppler  Date POD PCO2 HCT BP  MCA ACA PCA OPHT SIPH VERT Basilar  6/8 JE   33.6 145/53 Right  Left   56  *   *  *   *  *   24  25   *  *   -21  *   *      6/11 vs     Right  Left   44  *   *  *   53  *   25  25   67  *   47  -22   -46           Right  Left                                             Right  Left                                             Right  Left                                            Right  Left                                            Right  Left                                        MCA = Middle Cerebral Artery      OPHT = Opthalmic Artery     BASILAR = Basilar Artery   ACA = Anterior Cerebral Artery     SIPH = Carotid Siphon PCA = Posterior Cerebral Artery   VERT = Verterbral Artery                   Normal MCA = 62+\-12 ACA = 50+\-12 PCA = 42+\-23   6/8 Rt Lindegaard ratio 2.1. Very poor study due to poor windows and patient forcefully keeping head turned to left. JE 6/11 Poor study for reasons given on 6/8 Lindegaard ratio 1.16

## 2017-01-07 NOTE — Progress Notes (Signed)
Pt seen and examined. No issues overnight.  EXAM: Temp:  [98.3 F (36.8 C)-98.9 F (37.2 C)] 98.4 F (36.9 C) (06/11 0827) Pulse Rate:  [59-80] 79 (06/11 0800) Resp:  [18-81] 32 (06/11 0800) BP: (109-170)/(50-63) 157/62 (06/11 0800) SpO2:  [87 %-99 %] 95 % (06/11 0800) Arterial Line BP: (115-173)/(44-68) 164/54 (06/11 0800) FiO2 (%):  [40 %] 40 % (06/11 0422) Weight:  [85.6 kg (188 lb 11.4 oz)] 85.6 kg (188 lb 11.4 oz) (06/11 0330) Intake/Output      06/10 0701 - 06/11 0700 06/11 0701 - 06/12 0700   I.V. (mL/kg) 2753.1 (32.2) 89.7 (1)   NG/GT 720 30   IV Piggyback 520    Total Intake(mL/kg) 3993.1 (46.6) 119.7 (1.4)   Urine (mL/kg/hr) 2715 (1.3)    Stool 600 (0.3)    Total Output 3315     Net +678.1 +119.7        Stool Occurrence  1 x     Propofol off ~1hr: No eye opening to pain Pupils reactive (+) right corneal  (+) weak cough/gag Breathes over vent Minimal movement to noxious stim  LABS: Lab Results  Component Value Date   CREATININE 1.12 (H) 01/07/2017   BUN 42 (H) 01/07/2017   NA 154 (H) 01/07/2017   K 3.6 01/07/2017   CL >130 (HH) 01/07/2017   CO2 12 (L) 01/07/2017   Lab Results  Component Value Date   WBC 9.1 01/07/2017   HGB 11.0 (L) 01/07/2017   HCT 34.7 (L) 01/07/2017   MCV 88.5 01/07/2017   PLT 244 01/07/2017    IMPRESSION: - 66 y.o. female SAH d#4 s/p Acom coiling. Likely very poor prognosis given presenting grade (HH 5) and no neurologic improvement over last 4 days.  PLAN: - Cont supportive care - Will likely need to have candid discussion with pts family regarding poor prognosis and options for goals of care (palliative v long-term care)

## 2017-01-07 NOTE — Progress Notes (Signed)
STROKE TEAM PROGRESS NOTE   SUBJECTIVE (INTERVAL HISTORY) Dr Conchita Paris is at bedside.Pt still intubated on propofol, not following commands. .She goes into respiratory distress when sedation is weaned off  OBJECTIVE Temp:  [98.3 F (36.8 C)-98.9 F (37.2 C)] 98.4 F (36.9 C) (06/11 0827) Pulse Rate:  [63-92] 77 (06/11 1100) Cardiac Rhythm: Normal sinus rhythm (06/10 2000) Resp:  [18-81] 32 (06/11 1100) BP: (119-170)/(50-63) 148/52 (06/11 1100) SpO2:  [87 %-99 %] 96 % (06/11 1100) Arterial Line BP: (119-173)/(44-64) 143/61 (06/11 1100) FiO2 (%):  [40 %] 40 % (06/11 1100) Weight:  [188 lb 11.4 oz (85.6 kg)] 188 lb 11.4 oz (85.6 kg) (06/11 0330)  CBC:  Recent Labs Lab Jan 04, 2017 1810  01/06/17 0404 01/07/17 0344  WBC 12.9*  < > 9.4 9.1  NEUTROABS 5.3  --   --   --   HGB 11.9*  < > 10.4* 11.0*  HCT 36.0  < > 32.9* 34.7*  MCV 85.9  < > 88.9 88.5  PLT 236  < > 213 244  < > = values in this interval not displayed.  Basic Metabolic Panel:   Recent Labs Lab 01/05/17 0435 01/05/17 1950 01/06/17 0404 01/07/17 0344  NA 147*  --  150* 154*  K 3.7  --  3.5 3.6  CL 127*  --  129* >130*  CO2 13*  --  11* 12*  GLUCOSE 303*  --  132* 183*  BUN 26*  --  35* 42*  CREATININE 1.13*  --  1.02* 1.12*  CALCIUM 7.7*  --  8.2* 8.6*  MG 2.1 2.0  --  2.4  PHOS 2.1* 4.3  --   --     Lipid Panel:     Component Value Date/Time   TRIG 414 (H) 01/05/2017 1950   HgbA1c: No results found for: HGBA1C Urine Drug Screen:     Component Value Date/Time   LABOPIA NONE DETECTED January 04, 2017 1821   COCAINSCRNUR NONE DETECTED January 04, 2017 1821   LABBENZ NONE DETECTED 01-04-17 1821   AMPHETMU NONE DETECTED 01/04/2017 1821   THCU NONE DETECTED 01-04-2017 1821   LABBARB NONE DETECTED 01/04/17 1821    Alcohol Level     Component Value Date/Time   ETH <5 01/04/2017 1810    IMAGING I have personally reviewed the radiological images below and agree with the radiology interpretations.  Ct Head  Wo Contrast January 04, 2017 1. Massive subarachnoid hemorrhage over both convexities and within the sylvian fissures, basal cisterns and the lateral and third ventricles. Given the distribution, an aneurysmal etiology is favored, though other entities may also cause this distribution. However, it is somewhat unclear whether there may be a component of intraparenchymal hemorrhage of the inferomedial frontal lobe. CTA of the head is recommended. 2. Effacement of the basal cisterns and crowding of the foramen magnum with early/impending herniation of the cerebellar tonsils.   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 No aneurysm filling is seen post-embolization.   TTE - Left ventricle: The cavity size was normal. There was moderate   focal basal hypertrophy. Systolic function was vigorous. The   estimated ejection fraction was in the range of 65% to 70%. Wall   motion was normal; there were no regional wall motion   abnormalities. Left ventricular diastolic function parameters   were normal. - Aortic valve: Poorly visualized. Valve area (VTI): 1.91 cm^2.   Valve area (Vmax): 1.83 cm^2. Valve area (Vmean):  1.9 cm^2.  EEG - This is an abnormal EEG due to moderate diffuse generalized slowing with poor reactivity and disorganization of the background. No seizures. There is a greater degree of slowing in the right frontal region suggesting more localized cerebral dysfunction in that area. Clinical correlation: This study is consistent with moderate global cerebral dysfunction with possibly greater dysfunction in the right frontal region. No evidence of seizures. Clinical correlation is recommended.  TCD no vasospasm but limited study   PHYSICAL EXAM  Temp:  [98.3 F (36.8 C)-98.9 F (37.2 C)] 98.4 F (36.9 C) (06/11 0827) Pulse Rate:  [63-92] 77 (06/11 1100) Resp:  [18-81] 32 (06/11  1100) BP: (119-170)/(50-63) 148/52 (06/11 1100) SpO2:  [87 %-99 %] 96 % (06/11 1100) Arterial Line BP: (119-173)/(44-64) 143/61 (06/11 1100) FiO2 (%):  [40 %] 40 % (06/11 1100) Weight:  [188 lb 11.4 oz (85.6 kg)] 188 lb 11.4 oz (85.6 kg) (06/11 0330)  General - Well nourished, well developed, intubated on sedation.  Ophthalmologic - Fundi not visualized due to small pupils.  Cardiovascular - Regular rate and rhythm. 2 + edema extremities  Neuro - intubated on sedation, not open eyes or following commands. Eyes middle position, conjugate, but slight doll's eyes on head moving. Pupils 2mm equal round, but very sluggish to light. weak corneals but positive gag. Breathing over the vent. On pain stimulation,  able to withdraw in all extremities with LUE more allmost purposeful movement than other limbs mild withdraw on BLEs. Increased tone bilaterally, with bilateral babinski. Sensation, coordination and gait not tested.   ASSESSMENT/PLAN [name] is a 66 y.o. female with history of HTN presenting with severe HA which progressed to unresponsive state, seizure activity and vomiting. CT showed a extensive SAH.   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 Effacement of the basal cisterns with early/impending cerebellar herniation  Cerebral angio successfully coiled ruptured ACOM aneurysm  NSG on board  TCD pending  TTE EF 65-70%  EEG diffuse slowing and no seizure  SCDs for VTE prophylaxis Diet NPO time specified  No antithrombotic prior to admission  Nimodipine started 2017-01-09  Ongoing aggressive stroke risk factor management  Therapy recommendations:  pending   Disposition:  Pending   Respiratory failure  Secondary to [facility]  Intubated in ED  managemed by CCM  Hypertensive Emergency  SBP 300 palp on scene  On cardene for BP control  SBP  goal < 160   Currently BP at goal.    ? Seizure - more likely stimulation induced decorticate posturing with unsustained LUE myoclonus, resolved on today's exam  High risk for seizure  on Keppra 1000mg  bid  EEG diffuse slowing but no seizure  Seizure precuation  Other Stroke Risk Factors  Advanced age  Other Active Problems  hypocalcemia  Hypokalemia  Hospital day # 5  This patient is critically ill due to extensive SAH and coma with posturing and at significant risk of neurological worsening, death form recurrent hemorrhage, hydrocephalus, seizure, and vasospasm. This patient's care requires constant monitoring of vital signs, hemodynamics, respiratory and cardiac monitoring, review of multiple databases, neurological assessment, discussion with family, other specialists and medical decision making of high complexity. I spent 30 minutes of neurocritical care time in the care of this patient. Discussed with Dr Conchita Paris  .  Marland Kitchen Continue oral blood pressure medications through NG tube  and wean Cardene drip as tolerated. Prognosis is guarded given poor neurological exam. I I think it is necessarily to our multidisciplinary goals of care discussion with the family has a prognosis is terrible chances of survival with meaningful improvement is quite slim. Palliative care and comfort care may be appropriate.  June Leapramod Sethi,MD Stroke Neurology 01/07/2017 12:29 PM  To contact Stroke Continuity provider, please refer to [email or web address]. After hours, contact General Neurology

## 2017-01-07 NOTE — Progress Notes (Signed)
VASCULAR LAB PRELIMINARY  PRELIMINARY  PRELIMINARY  PRELIMINARY  TCH completed.    Preliminary report:  [facility]  Shavonna Corella, RVS 01/07/2017, 5:41 PM

## 2017-01-07 NOTE — Progress Notes (Signed)
CDS referral made [id number]

## 2017-01-07 NOTE — Progress Notes (Signed)
The pt's brother, Sherri Hale, visited this afternoon.  He asked repeatedly who will make decisions on the pt's behalf. I explained that from what I know the pt's next of kin is the oldest daughter, Sherri Hale.  I said it would be best for the family to all be here in the morning when the MDs round. I got the sense there was tension within the family.

## 2017-01-07 NOTE — Anesthesia Postprocedure Evaluation (Signed)
Anesthesia Post Note  Patient: Sherri Hale  Procedure(s) Performed: Procedure(s) (LRB): RADIOLOGY WITH ANESTHESIA (N/A)     Patient location during evaluation: PACU Anesthesia Type: General Level of consciousness: awake and sedated Pain management: pain level controlled Vital Signs Assessment: post-procedure vital signs reviewed and stable Respiratory status: spontaneous breathing, nonlabored ventilation, respiratory function stable and patient connected to nasal cannula oxygen Cardiovascular status: blood pressure returned to baseline and stable Postop Assessment: no signs of nausea or vomiting Anesthetic complications: no    Last Vitals:  Vitals:   01/07/17 0645 01/07/17 0700  BP:  (!) 148/55  Pulse: 73 71  Resp: (!) 23 (!) 25  Temp:      Last Pain:  Vitals:   01/07/17 0400  TempSrc: Axillary  PainSc:                  [name],[name]

## 2017-01-07 NOTE — Progress Notes (Signed)
Pt's daughter & POA [employer](Mayra) currently at beside and will be back in the morning until 11:00 before she goes to work.  I made CCM aware so they can update her in the morning.

## 2017-01-07 NOTE — Progress Notes (Signed)
Pt's daughter, Elmyra RicksMayra, who is the oldest child, called later today requesting that the MD only call her about making decisions.  She was "told" by another family member that a MD called her uncle to give an update.  I explained I was not aware of any such conversation today and the MD will update her personally Tuesday morning during rounds.

## 2017-01-07 NOTE — Progress Notes (Signed)
[employer] System changed.

## 2017-01-08 ENCOUNTER — Inpatient Hospital Stay (HOSPITAL_COMMUNITY): Payer: Medicare HMO

## 2017-01-08 LAB — CBC
HCT: 33.6 % — ABNORMAL LOW (ref 36.0–46.0)
HEMOGLOBIN: 11 g/dL — AB (ref 12.0–15.0)
MCH: 28.2 pg (ref 26.0–34.0)
MCHC: 32.7 g/dL (ref 30.0–36.0)
MCV: 86.2 fL (ref 78.0–100.0)
PLATELETS: 275 10*3/uL (ref 150–400)
RBC: 3.9 MIL/uL (ref 3.87–5.11)
RDW: 14.9 % (ref 11.5–15.5)
WBC: 10.1 10*3/uL (ref 4.0–10.5)

## 2017-01-08 LAB — BLOOD GAS, ARTERIAL
Acid-base deficit: 12.9 mmol/L — ABNORMAL HIGH (ref 0.0–2.0)
BICARBONATE: 11.9 mmol/L — AB (ref 20.0–28.0)
DRAWN BY: 345601
FIO2: 50
MECHVT: 490 mL
O2 Saturation: 97.6 %
PEEP: 5 cmH2O
PO2 ART: 106 mmHg (ref 83.0–108.0)
Patient temperature: 98.6
RATE: 20 resp/min
pCO2 arterial: 23 mmHg — ABNORMAL LOW (ref 32.0–48.0)
pH, Arterial: 7.334 — ABNORMAL LOW (ref 7.350–7.450)

## 2017-01-08 LAB — BASIC METABOLIC PANEL
BUN: 50 mg/dL — ABNORMAL HIGH (ref 6–20)
CO2: 14 mmol/L — ABNORMAL LOW (ref 22–32)
Calcium: 9 mg/dL (ref 8.9–10.3)
Chloride: >130 mmol/L (ref 101–111)
Creatinine, Ser: 1.13 mg/dL — ABNORMAL HIGH (ref 0.44–1.00)
GFR, EST AFRICAN AMERICAN: 57 mL/min — AB (ref >60)
GFR, EST NON AFRICAN AMERICAN: 50 mL/min — AB (ref >60)
Glucose, Bld: 132 mg/dL — ABNORMAL HIGH (ref 65–99)
POTASSIUM: 3.6 mmol/L (ref 3.5–5.1)
SODIUM: 159 mmol/L — AB (ref 135–145)

## 2017-01-08 LAB — CULTURE, RESPIRATORY W GRAM STAIN

## 2017-01-08 LAB — MAGNESIUM: MAGNESIUM: 2.3 mg/dL (ref 1.7–2.4)

## 2017-01-08 LAB — GLUCOSE, CAPILLARY
GLUCOSE-CAPILLARY: 119 mg/dL — AB (ref 65–99)
GLUCOSE-CAPILLARY: 166 mg/dL — AB (ref 65–99)
Glucose-Capillary: 187 mg/dL — ABNORMAL HIGH (ref 65–99)
Glucose-Capillary: 243 mg/dL — ABNORMAL HIGH (ref 65–99)
Glucose-Capillary: 181 mg/dL — ABNORMAL HIGH (ref 65–99)

## 2017-01-08 LAB — PHOSPHORUS: PHOSPHORUS: 4.5 mg/dL (ref 2.5–4.6)

## 2017-01-08 LAB — CULTURE, RESPIRATORY

## 2017-01-08 MED ORDER — FREE WATER
100.0000 mL | Freq: Three times a day (TID) | Status: DC
  Administered 2017-01-08 – 2017-01-09 (×3): 100 mL

## 2017-01-08 MED ORDER — ACETAMINOPHEN 160 MG/5ML PO SOLN
650.0000 mg | Freq: Four times a day (QID) | ORAL | Status: DC | PRN
  Administered 2017-01-08: 650 mg via ORAL
  Filled 2017-01-08: qty 20.3

## 2017-01-08 NOTE — Plan of Care (Signed)
  Interdisciplinary Goals of Care Family Meeting   Date carried out:: 01/08/2017  Location of Sherri meeting: Conference room  Member's involved: Physician, Bedside Registered Nurse and Family Member or next of kin  Durable Power of Attorney or Environmental health practitioneracting medical decision maker: Daughter    Discussion: We discussed goals of care for Sherri Hale .  Lengthy conversation with Sherri patient's daughter and brother. Discussed her multiple medical problems and poor prognosis for recovery. Attempted to address CODE STATUS. Daughter is okay with a DO NOT RESUSCITATE status but brother would like to discuss further with additional siblings out of [place] country. Explained that with Sherri patient's multiple medical problems her chances for a meaningful recovery are nearly nonexistent. Also explained that she would likely end up intratracheally intubated and depending upon others for her care for Sherri rest of her natural life. Family does report that she was independent. We did discuss transitioning to full comfort care with a one way extubation but neither are ready for this transition yet. They would like to discuss further with Neurology.  Code status: Full Code  Disposition: Continue current acute care  Time spent for Sherri meeting: 22 minutes  Sherri Hale 01/08/2017, 10:36 AM

## 2017-01-08 NOTE — Progress Notes (Signed)
   01/08/17 1155  Clinical Encounter Type  Visited With Patient and family together  Visit Type Spiritual support  Spiritual Encounters  Spiritual Needs Prayer  Stress Factors  Patient Stress Factors Major life changes  Family Stress Factors Family relationships  Paged for prayer and Spanish Interpretor. Introduction to Pt's brother as Pt intubated. Brother speaks [place]. Offered prayer in [place].

## 2017-01-08 NOTE — Progress Notes (Signed)
STROKE TEAM PROGRESS NOTE   SUBJECTIVE (INTERVAL HISTORY) Dr Ashok Cordia is at bedside.Pt still intubated on propofol yet appears uncomfortable, not following commands. .She goes into respiratory distress when sedation is weaned off  OBJECTIVE Temp:  [97.4 F (36.3 C)-100.2 F (37.9 C)] 100.2 F (37.9 C) (06/12 1200) Pulse Rate:  [59-103] 67 (06/12 1300) Cardiac Rhythm: Normal sinus rhythm (06/12 0600) Resp:  [17-39] 25 (06/12 1300) BP: (106-177)/(49-111) 137/56 (06/12 1300) SpO2:  [92 %-99 %] 95 % (06/12 1300) Arterial Line BP: (106-175)/(48-67) 116/61 (06/12 1300) FiO2 (%):  [40 %] 40 % (06/12 0919) Weight:  [190 lb 11.2 oz (86.5 kg)] 190 lb 11.2 oz (86.5 kg) (06/12 0440)  CBC:  Recent Labs Lab 01/06/2017 1810  01/07/17 0344 01/08/17 0442  WBC 12.9*  < > 9.1 10.1  NEUTROABS 5.3  --   --   --   HGB 11.9*  < > 11.0* 11.0*  HCT 36.0  < > 34.7* 33.6*  MCV 85.9  < > 88.5 86.2  PLT 236  < > 244 275  < > = values in this interval not displayed.  Basic Metabolic Panel:   Recent Labs Lab 01/05/17 1950  01/07/17 0344 01/08/17 0442  NA  --   < > 154* 159*  K  --   < > 3.6 3.6  CL  --   < > >130* >130*  CO2  --   < > 12* 14*  GLUCOSE  --   < > 183* 132*  BUN  --   < > 42* 50*  CREATININE  --   < > 1.12* 1.13*  CALCIUM  --   < > 8.6* 9.0  MG 2.0  --  2.4 2.3  PHOS 4.3  --   --  4.5  < > = values in this interval not displayed.  Lipid Panel:     Component Value Date/Time   TRIG 414 (H) 01/05/2017 1950   HgbA1c: No results found for: HGBA1C Urine Drug Screen:     Component Value Date/Time   LABOPIA NONE DETECTED 01/07/2017 1821   COCAINSCRNUR NONE DETECTED 01/20/2017 1821   LABBENZ NONE DETECTED 01/08/2017 1821   AMPHETMU NONE DETECTED 01/09/2017 1821   THCU NONE DETECTED 01/07/2017 1821   LABBARB NONE DETECTED 01/19/2017 1821    Alcohol Level     Component Value Date/Time   ETH <5 01/09/2017 1810    IMAGING I have personally reviewed the radiological images  below and agree with the radiology interpretations.  Ct Head Wo Contrast 01/19/2017 1. Massive subarachnoid hemorrhage over both convexities and within the sylvian fissures, basal cisterns and the lateral and third ventricles. Given the distribution, an aneurysmal etiology is favored, though other entities may also cause this distribution. However, it is somewhat unclear whether there may be a component of intraparenchymal hemorrhage of the inferomedial frontal lobe. CTA of the head is recommended. 2. Effacement of the basal cisterns and crowding of the foramen magnum with early/impending herniation of the cerebellar tonsils.   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 No aneurysm filling is seen post-embolization.   TTE - Left ventricle: The cavity size was normal. There was moderate   focal basal hypertrophy. Systolic function was vigorous. The   estimated ejection fraction was in the range of 65% to 70%. Wall   motion was normal; there were no regional wall motion   abnormalities. Left ventricular diastolic  function parameters   were normal. - Aortic valve: Poorly visualized. Valve area (VTI): 1.91 cm^2.   Valve area (Vmax): 1.83 cm^2. Valve area (Vmean): 1.9 cm^2.  EEG - This is an abnormal EEG due to moderate diffuse generalized slowing with poor reactivity and disorganization of the background. No seizures. There is a greater degree of slowing in the right frontal region suggesting more localized cerebral dysfunction in that area. Clinical correlation: This study is consistent with moderate global cerebral dysfunction with possibly greater dysfunction in the right frontal region. No evidence of seizures. Clinical correlation is recommended.  TCD no vasospasm but limited study   PHYSICAL EXAM  Temp:  [97.4 F (36.3 C)-100.2 F (37.9 C)] 100.2 F (37.9 C) (06/12  1200) Pulse Rate:  [59-103] 67 (06/12 1300) Resp:  [17-39] 25 (06/12 1300) BP: (106-177)/(49-111) 137/56 (06/12 1300) SpO2:  [92 %-99 %] 95 % (06/12 1300) Arterial Line BP: (106-175)/(48-67) 116/61 (06/12 1300) FiO2 (%):  [40 %] 40 % (06/12 0919) Weight:  [190 lb 11.2 oz (86.5 kg)] 190 lb 11.2 oz (86.5 kg) (06/12 0440)  General - Well nourished, well developed, intubated on sedation. In mild resp distress  Ophthalmologic - Fundi not visualized due to small pupils.  Cardiovascular - Regular rate and rhythm. 2 + edema extremities  Neuro - intubated on sedation, not open eyes or following commands. Eyes middle position, conjugate, but slight doll's eyes on head moving. Pupils 28m equal round, but very sluggish to light. weak corneals but positive gag. Breathing over the vent. On pain stimulation,  able to withdraw in all extremities with LUE more allmost purposeful movement than other limbs mild withdraw on BLEs. Increased tone bilaterally, with bilateral babinski. Sensation, coordination and gait not tested.   ASSESSMENT/PLAN Ms. ARemingtyn Hale a 66y.o. female with history of HTN presenting with severe HA which progressed to unresponsive state, seizure activity and vomiting. CT showed a extensive SAH.   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    ? Seizure - more likely stimulation induced decorticate posturing with unsustained LUE myoclonus, resolved on today's exam  High risk for seizure  on Keppra [id number]  EEG diffuse slowing but no seizure  Seizure precuation  Other Stroke Risk Factors  Advanced age  Other Active Problems  hypocalcemia  Hypokalemia  Hospital day # 6  This patient is critically ill due to extensive SAH and coma with posturing and at significant risk of neurological worsening, death form recurrent hemorrhage, hydrocephalus, seizure, and vasospasm. This patient's care requires constant monitoring of vital signs, hemodynamics, respiratory and cardiac monitoring, review of multiple databases, neurological assessment, discussion with family, other specialists and medical decision making of high complexity. I spent 35  minutes of neurocritical care time in the care of this patient. Discussed with Dr Kathyrn Sheriff and Ashok Cordia  .  Marland Kitchen   Prognosis is guarded given poor neurological exam. I MET WITH PATIENT'S SON AT THE BEDSIDE AND DISCUSSED goals of care discussion with the family has a prognosis is terrible chances of survival with meaningful improvement is quite slim. Palliative care and comfort care may be appropriate. The family is struggling with the decision and hopefully will make it soon. I do not believe prolonged ventilatory support, tracheostomy and PEG tube will benefit this patient. Palliative care consult will also be appropriate Carola Frost Stroke Neurology 01/08/2017 1:52 PM  To contact Stroke Continuity provider, please refer to [email or web address] After hours, contact General Neurology

## 2017-01-08 NOTE — Progress Notes (Signed)
[place] [facility] Attending Note  ADMISSION DATE:  01/16/2017  CONSULTATION DATE:  01/26/2017  REFERRING MD:  Dr. Oleta Mouse EDP  CHIEF COMPLAINT:  SAH  Presenting HPI:  66 y.o.  female with past medical history of hypertension who presented to [facility] emergency department 6/6 via EMS after collapsing at [facility]. While at the [facility] she was complaining of severe headache and then suddenly became unresponsive. She developed altered mental status and vomited.  Intubated for airway protection.  Found to be profoundly hypertensive with systolic blood pressures in the 230s. In the emergency department she was started on nicardipine infusion and loaded with IV Keppra. She was taken for a CT scan of the head which showed extensive subarachnoid hemorrhage. She was evaluated by Neurosurgery.   Ultimately went to Neuro IR for coiling.   Subjective:  No acute events overnight. Patient with continued rhonchi.   Review of Systems:  Unable to obtain given intubation & sedation.   Vent Mode: PRVC FiO2 (%):  [40 %] 40 % Set Rate:  [20 bmp] 20 bmp Vt Set:  [490 mL] 490 mL PEEP:  [5 cmH20] 5 cmH20 Plateau Pressure:  [18 cmH20] 18 cmH20  Temp:  [97.4 F (36.3 C)-99.2 F (37.3 C)] 99.1 F (37.3 C) (06/12 0400) Pulse Rate:  [59-103] 77 (06/12 0600) Resp:  [17-40] 32 (06/12 0600) BP: (106-177)/(51-111) 156/59 (06/12 0600) SpO2:  [92 %-99 %] 98 % (06/12 0600) Arterial Line BP: (106-175)/(48-65) 162/62 (06/12 0600) FiO2 (%):  [40 %] 40 % (06/12 0600) Weight:  [190 lb 11.2 oz (86.5 kg)] 190 lb 11.2 oz (86.5 kg) (06/12 0440)  General:  No family at bedside. Intubated. No distress. Integument:  Warm & dry. No rash on exposed skin. HEENT:  Moist mucus memebranes. No scleral icterus. Endotracheal tube in place. Neurological:  Pupils symmetric. Spontaneously moving upper extremities. Not following commands. Musculoskeletal:  No joint effusion or erythema appreciated. Symmetric muscle  bulk. Pulmonary:  Symmetric chest wall rise on ventilator. Coarse, rhonchorous breath sounds bilaterally. Cardiovascular:  Regular rate. No appreciable JVD. Normal S1 & S2. Anasarca. Abdomen:  Soft. Protuberant. Normoactive bowel sounds.  LINES/TUBES: OETT 6/6 >>> RUE PICC 6/9 >>> Foley 6/6 >>> OGT 6/6 >>> L Rad Art Line 6/6 >>> PIV  CBC Latest Ref Rng & Units 01/08/2017 01/07/2017 01/06/2017  WBC 4.0 - 10.5 K/uL 10.1 9.1 9.4  Hemoglobin 12.0 - 15.0 g/dL 11.0(L) 11.0(L) 10.4(L)  Hematocrit 36.0 - 46.0 % 33.6(L) 34.7(L) 32.9(L)  Platelets 150 - 400 K/uL 275 244 213    BMP Latest Ref Rng & Units 01/08/2017 01/07/2017 01/06/2017  Glucose 65 - 99 mg/dL 132(H) 183(H) 132(H)  BUN 6 - 20 mg/dL 50(H) 42(H) 35(H)  Creatinine 0.44 - 1.00 mg/dL 1.13(H) 1.12(H) 1.02(H)  Sodium 135 - 145 mmol/L 159(H) 154(H) 150(H)  Potassium 3.5 - 5.1 mmol/L 3.6 3.6 3.5  Chloride 101 - 111 mmol/L >130(HH) >130(HH) 129(H)  CO2 22 - 32 mmol/L 14(L) 12(L) 11(L)  Calcium 8.9 - 10.3 mg/dL 9.0 8.6(L) 8.2(L)   Hepatic Function Latest Ref Rng & Units 01/04/2017 01/01/2017  Total Protein 6.5 - 8.1 g/dL 5.7(L) 5.7(L)  Albumin 3.5 - 5.0 g/dL 3.3(L) 3.7  AST 15 - 41 U/L 39 67(H)  ALT 14 - 54 U/L 67(H) 64(H)  Alk Phosphatase 38 - 126 U/L 94 107  Total Bilirubin 0.3 - 1.2 mg/dL 1.4(H) 0.8    IMAGING/STUDIES: UDS 6/6:  Negative  CT HEAD W/O 6/6: IMPRESSION: 1. Massive subarachnoid  hemorrhage over both convexities and within the sylvian fissures, basal cisterns and the lateral and third ventricles. Given the distribution, an aneurysmal etiology is favored, though other entities may also cause this distribution. However, it is somewhat unclear whether there may be a component of intraparenchymal hemorrhage of the inferomedial frontal lobe. CTA of the head is recommended. 2. Effacement of the basal cisterns and crowding of the foramen magnum with early/impending herniation of the cerebellar tonsils. PORT CXR 6/12:  Personally  reviewed by me. Endotracheal tube in good position. Enteric feeding tube coursing below diaphragm. Persistent lower lobe and perihilar patchy opacification with some slight worsening of silhouetting of bilateral hemidiaphragms.  MICROBIOLOGY: MRSA PCR 6/6:  Negative  Tracheal Aspirate Culture 6/9: MSSA & Klebsiella (sensitive to Unasyn)  ANTIBIOTICS: Unasyn 6/10 >>>  SIGNIFICANT EVENTS: 06/06 - Admit 06/07 - ACOM aneurysm coiling 06/09 - antibiotics empirically started for increased secretions  ASSESSMENT/PLAN:  66 y.o. female admitted with a subarachnoid hemorrhage status post aneurysmal coiling now with MSSA & Klebsiella healthcare associated pneumonia.  1. Subarachnoid hemorrhage: Management per neurology & neurosurgery. Status post aneurysm coiling. Continuing Keppra.  2. Acute hypoxic respiratory failure: Multifactorial from inability to protect airway and healthcare associated pneumonia. 3. Acute encephalopathy: Multifactorial from hypernatremia as well as subarachnoid hemorrhage. 4. Klebsiella & MSSA healthcare associated pneumonia: Continuing Unasyn given sensitivities. 5. Hypertensive emergency: Goal systolic blood pressure less than 160. Continuing losartan and hydrochlorothiazide. Monitoring vitals per unit protocol. 6. Anemia: Multifactorial. Continuing to trend cell counts daily with CBC. 7. Hyperglycemia: No history of diabetes mellitus. Continuing Lantus 10 units subcutaneous daily & Accu-Cheks every 4 hours with sensitive sliding scale algorithm. 8. Hypernatremia/hyperchloremia: Progressively worsening. Starting free water 100cc VT q8hr. 9. Metabolic acidosis: Likely due to hyperchloremia. Monitoring daily.   Prophylaxis:  SCDs & Protonix IV daily at bedtime. Diet:  Nothing by mouth. Continuing tube feedings. Code Status: Full code per previous physician discussion. Disposition:  Patient's prognosis is grim & unlikely to survive. Family Update:  No family at bedside at  the time of my rounds.  I have personally spent a total of 36 minutes of critical care time today caring for the patient, discussing plan of care/prognosis with Neurology/Dr. Leonie Man, & reviewing the patient's electronic medical record.  Sonia Baller Ashok Cordia, M.D. [facility] Pulmonary [facility] Critical Care Pager:  [phone] After 3pm or if no response, call [phone] 7:10 AM 01/08/17

## 2017-01-08 NOTE — Progress Notes (Signed)
CRITICAL VALUE ALERT  Critical Value:  Cl >130  Date & Time Notied: 01/08/17, [id number]  Provider Notified: Dr. Vassie LollAlva  Orders Received/Actions taken: No new orders at this time. Will continue to monitor. Dicie BeamFrazier, Legaci Tarman RN BSN

## 2017-01-08 NOTE — Progress Notes (Signed)
Sputum sample obtained and sent down to main lab without complications.  

## 2017-01-09 ENCOUNTER — Ambulatory Visit (HOSPITAL_COMMUNITY): Payer: Medicare HMO

## 2017-01-09 DIAGNOSIS — Z66 Do not resuscitate: Secondary | ICD-10-CM

## 2017-01-09 DIAGNOSIS — I729 Aneurysm of unspecified site: Secondary | ICD-10-CM

## 2017-01-09 DIAGNOSIS — I609 Nontraumatic subarachnoid hemorrhage, unspecified: Secondary | ICD-10-CM

## 2017-01-09 DIAGNOSIS — Z515 Encounter for palliative care: Secondary | ICD-10-CM

## 2017-01-09 LAB — CBC WITH DIFFERENTIAL/PLATELET
Basophils Absolute: 0 10*3/uL (ref 0.0–0.1)
Basophils Relative: 0 %
EOS ABS: 0.1 10*3/uL (ref 0.0–0.7)
EOS PCT: 1 %
HCT: 31 % — ABNORMAL LOW (ref 36.0–46.0)
HEMOGLOBIN: 10.1 g/dL — AB (ref 12.0–15.0)
LYMPHS ABS: 1.2 10*3/uL (ref 0.7–4.0)
Lymphocytes Relative: 15 %
MCH: 28.3 pg (ref 26.0–34.0)
MCHC: 32.6 g/dL (ref 30.0–36.0)
MCV: 86.8 fL (ref 78.0–100.0)
MONOS PCT: 8 %
Monocytes Absolute: 0.7 10*3/uL (ref 0.1–1.0)
Neutro Abs: 6.4 10*3/uL (ref 1.7–7.7)
Neutrophils Relative %: 76 %
Platelets: 236 10*3/uL (ref 150–400)
RBC: 3.57 MIL/uL — ABNORMAL LOW (ref 3.87–5.11)
RDW: 15.2 % (ref 11.5–15.5)
WBC: 8.5 10*3/uL (ref 4.0–10.5)

## 2017-01-09 LAB — GLUCOSE, CAPILLARY
GLUCOSE-CAPILLARY: 139 mg/dL — AB (ref 65–99)
GLUCOSE-CAPILLARY: 163 mg/dL — AB (ref 65–99)
GLUCOSE-CAPILLARY: 221 mg/dL — AB (ref 65–99)
GLUCOSE-CAPILLARY: 194 mg/dL — AB (ref 65–99)
Glucose-Capillary: 150 mg/dL — ABNORMAL HIGH (ref 65–99)
Glucose-Capillary: 171 mg/dL — ABNORMAL HIGH (ref 65–99)

## 2017-01-09 LAB — RENAL FUNCTION PANEL
Albumin: 2 g/dL — ABNORMAL LOW (ref 3.5–5.0)
BUN: 52 mg/dL — ABNORMAL HIGH (ref 6–20)
CO2: 16 mmol/L — AB (ref 22–32)
CREATININE: 1.24 mg/dL — AB (ref 0.44–1.00)
Calcium: 8.5 mg/dL — ABNORMAL LOW (ref 8.9–10.3)
GFR calc non Af Amer: 44 mL/min — ABNORMAL LOW (ref >60)
GFR, EST AFRICAN AMERICAN: 51 mL/min — AB (ref >60)
Glucose, Bld: 181 mg/dL — ABNORMAL HIGH (ref 65–99)
Phosphorus: 4.6 mg/dL (ref 2.5–4.6)
Potassium: 2.8 mmol/L — ABNORMAL LOW (ref 3.5–5.1)
Sodium: 163 mmol/L (ref 135–145)

## 2017-01-09 LAB — URINALYSIS, ROUTINE W REFLEX MICROSCOPIC
BILIRUBIN URINE: NEGATIVE
GLUCOSE, UA: NEGATIVE mg/dL
HGB URINE DIPSTICK: NEGATIVE
Ketones, ur: NEGATIVE mg/dL
Leukocytes, UA: NEGATIVE
Nitrite: NEGATIVE
PH: 5 (ref 5.0–8.0)
Protein, ur: NEGATIVE mg/dL
SPECIFIC GRAVITY, URINE: 1.018 (ref 1.005–1.030)

## 2017-01-09 LAB — MAGNESIUM: MAGNESIUM: 2.2 mg/dL (ref 1.7–2.4)

## 2017-01-09 MED ORDER — SODIUM CHLORIDE 0.45 % IV SOLN
INTRAVENOUS | Status: DC
  Administered 2017-01-09: 12:00:00 via INTRAVENOUS

## 2017-01-09 MED ORDER — CIPROFLOXACIN IN D5W 400 MG/200ML IV SOLN
400.0000 mg | Freq: Two times a day (BID) | INTRAVENOUS | Status: DC
  Administered 2017-01-09 – 2017-01-12 (×8): 400 mg via INTRAVENOUS
  Filled 2017-01-09 (×9): qty 200

## 2017-01-09 MED ORDER — FREE WATER
200.0000 mL | Status: DC
  Administered 2017-01-09 – 2017-01-10 (×6): 200 mL

## 2017-01-09 NOTE — Progress Notes (Signed)
Transcranial Doppler  Date POD PCO2 HCT BP  MCA ACA PCA OPHT SIPH VERT Basilar  6/8 JE   33.6 145/53 Right  Left   56  *   *  *   *  *   24  25   *  *   -21  *   *      6/11 vs     Right  Left   44  *   *  *   53  *   25  25   67  *   47  -22   -46      6/13 JE   31 136/55 Right  Left   95  *   -77  *   45  *   34  17   *  *   -29  *   -42            Right  Left                                             Right  Left                                            Right  Left                                            Right  Left                                        MCA = Middle Cerebral Artery      OPHT = Opthalmic Artery     BASILAR = Basilar Artery   ACA = Anterior Cerebral Artery     SIPH = Carotid Siphon PCA = Posterior Cerebral Artery   VERT = Verterbral Artery                   Normal MCA = 62+\-12 ACA = 50+\-12 PCA = 42+\-23   6/8 Rt Lindegaard ratio 2.1. Very poor study due to poor windows and patient forcefully keeping head turned to left. JE 6/11 Poor study for reasons given on 6/8 Lindegaard ratio 1.16  6/13 Technically difficult due to poor windows, poor patient position, and movement from ventilator. Lindegaard ratio Right 2.2. JE

## 2017-01-09 NOTE — Progress Notes (Signed)
[place] [facility] Critical [facility] Attending Note  ADMISSION DATE:  01/06/2017  CONSULTATION DATE:  12/28/2016  REFERRING MD:  Dr. Oleta Mouse EDP  CHIEF COMPLAINT:  SAH  Presenting HPI:  66 y.o.  female with past medical history of hypertension who presented to [facility] emergency department 6/6 via EMS after collapsing at [facility]. While at the [facility] she was complaining of severe headache and then suddenly became unresponsive. She developed altered mental status and vomited.  Intubated for airway protection.  Found to be profoundly hypertensive with systolic blood pressures in the 230s. In the emergency department she was started on nicardipine infusion and loaded with IV Keppra. She was taken for a CT scan of the head which showed extensive subarachnoid hemorrhage. She was evaluated by Neurosurgery.   Ultimately went to Neuro IR for coiling.   Subjective:  No acute events overnight. Patient still not following commands. Patient was febrile again yesterday and cultures were obtained.  Review of Systems:  Unable to obtain given intubation & altered mentation.   Vent Mode: PRVC FiO2 (%):  [30 %-40 %] 30 % Set Rate:  [20 bmp] 20 bmp Vt Set:  [490 mL] 490 mL PEEP:  [5 cmH20] 5 cmH20 Plateau Pressure:  [16 cmH20-18 cmH20] 18 cmH20  Temp:  [97.7 F (36.5 C)-102.8 F (39.3 C)] 99.6 F (37.6 C) (06/13 0800) Pulse Rate:  [59-89] 60 (06/13 0911) Resp:  [22-38] 26 (06/13 0911) BP: (111-179)/(50-104) 111/51 (06/13 0911) SpO2:  [95 %-99 %] 99 % (06/13 0911) Arterial Line BP: (112-161)/(46-67) 114/46 (06/13 0900) FiO2 (%):  [30 %-40 %] 30 % (06/13 0911) Weight:  [194 lb 3.6 oz (88.1 kg)] 194 lb 3.6 oz (88.1 kg) (06/13 0500)  General:   No distress. No family at bedside.  Integument:  Warm & dry. No rash on exposed skin.  Extremities:  No cyanosis or clubbing.  HEENT:  Moist mucus membranes. No scleral injection. Endotracheal tube in place.  Cardiovascular:  Regular rhythm. Anasarca  unchanged. Unable to appreciate JVD. Pulmonary:  Coarse breath sounds bilaterally. Symmetric chest wall rise on ventilator. Abdomen: Soft. Hyperactive bowel sounds. Protuberant. Neurological: Pupils pinpoint and symmetric. Cough present. No withdrawal to pain in extremities.  LINES/TUBES: OETT 6/6 >>> RUE PICC 6/9 >>> Foley 6/6 >>> OGT 6/6 >>> L Rad Art Line 6/6 >>> PIV  CBC Latest Ref Rng & Units 01/09/2017 01/08/2017 01/07/2017  WBC 4.0 - 10.5 K/uL 8.5 10.1 9.1  Hemoglobin 12.0 - 15.0 g/dL 10.1(L) 11.0(L) 11.0(L)  Hematocrit 36.0 - 46.0 % 31.0(L) 33.6(L) 34.7(L)  Platelets 150 - 400 K/uL 236 275 244    BMP Latest Ref Rng & Units 01/09/2017 01/08/2017 01/07/2017  Glucose 65 - 99 mg/dL 181(H) 132(H) 183(H)  BUN 6 - 20 mg/dL 52(H) 50(H) 42(H)  Creatinine 0.44 - 1.00 mg/dL 1.24(H) 1.13(H) 1.12(H)  Sodium 135 - 145 mmol/L 163(HH) 159(H) 154(H)  Potassium 3.5 - 5.1 mmol/L 2.8(L) 3.6 3.6  Chloride 101 - 111 mmol/L >130(HH) >130(HH) >130(HH)  CO2 22 - 32 mmol/L 16(L) 14(L) 12(L)  Calcium 8.9 - 10.3 mg/dL 8.5(L) 9.0 8.6(L)   Hepatic Function Latest Ref Rng & Units 01/09/2017 01/04/2017 01/18/2017  Total Protein 6.5 - 8.1 g/dL - 5.7(L) 5.7(L)  Albumin 3.5 - 5.0 g/dL 2.0(L) 3.3(L) 3.7  AST 15 - 41 U/L - 39 67(H)  ALT 14 - 54 U/L - 67(H) 64(H)  Alk Phosphatase 38 - 126 U/L - 94 107  Total Bilirubin 0.3 - 1.2 mg/dL - 1.4(H) 0.8  IMAGING/STUDIES: UDS 6/6:  Negative  CT HEAD W/O 6/6: IMPRESSION: 1. Massive subarachnoid hemorrhage over both convexities and within the sylvian fissures, basal cisterns and the lateral and third ventricles. Given the distribution, an aneurysmal etiology is favored, though other entities may also cause this distribution. However, it is somewhat unclear whether there may be a component of intraparenchymal hemorrhage of the inferomedial frontal lobe. CTA of the head is recommended. 2. Effacement of the basal cisterns and crowding of the foramen magnum with  early/impending herniation of the cerebellar tonsils. PORT CXR 6/12:  Previously reviewed by me. Endotracheal tube in good position. Enteric feeding tube coursing below diaphragm. Persistent lower lobe and perihilar patchy opacification with some slight worsening of silhouetting of bilateral hemidiaphragms.  MICROBIOLOGY: MRSA PCR 6/6:  Negative  Tracheal Aspirate Culture 6/9: MSSA & Klebsiella (sensitive to Unasyn) Blood Cultures x2 6/12 >>> Tracheal Aspirate Culture 6/12 >>> Urine Culture 6/12 >>>  ANTIBIOTICS: Unasyn 6/10 - 6/13 Cipro 6/13 >>>  SIGNIFICANT EVENTS: 06/06 - Admit 06/07 - ACOM aneurysm coiling 06/09 - Antibiotics empirically started for increased secretions 06/12 - Febrile >> recultured 06/13 - Unasyn switched to Cipro given sensitivities & worsening hypernatremia. FWD 5.4 L>>started Free Water 200cc VT q4hr  ASSESSMENT/PLAN:  66 y.o. female with subarachnoid hemorrhage status post aneurysm coiling. Patient's persistent fever is concerning but her leukocyte count remained stable. Patient has shown no signs of neurologic recovery and currently is only on a Precedex drip.   1. Acute encephalopathy: Likely multifactorial. Weaning Precedex. Limiting sedatives. 2. Subarachnoid hemorrhage: Management per neurology. Status post aneurysm coiling. Continuing Keppra. 3. Acute hypoxic respiratory failure: Likely secondary to healthcare associated pneumonia. Continuing full ventilator support. 4. Klebsiella & MSSA healthcare associated pneumonia: Switching from Unasyn to Cipro day #4/14 given sensitivities and hypernatremia. 5. Hypernatremia/hyperchloremia: Free water deficit 5.4 L today. Increasing free water via tube to 200 cc every 4 hours. Monitoring electrolytes daily. 6. Hypertensive emergency: Goal systolic blood pressure per neurology. Continuing losartan and hydrochlorothiazide. Monitoring vitals per unit protocol. 7. Fever of unknown origin: Repeat cultures obtained on  6/12. Awaiting results. 8. Anemia: Multifactorial. No signs of active bleeding at this time. Trending cell counts daily with CBC. 9. Hyperglycemia: Glucose controlled. No history of diabetes mellitus. Continuing Lantus daily and sliding scale insulin per sensitive algorithm with Accu-Cheks every 4 hours. 10. Metabolic acidosis: Likely secondary to hyperchloremia. Monitoring electrolyte panel daily.   Prophylaxis:  SCDs & Protonix IV daily at bedtime. Diet:  Nothing by mouth. Continuing tube feedings. Code Status: Full code per previous physician discussion. Disposition:  Patient's prognosis is grim & unlikely to survive without tracheostomy and PEG tube placement. Family Update:  Brother updated briefly at bedside this morning prior to my rounds.  I have spent a total of 32 minutes of critical care time today caring for the patient and reviewing the patient's electronic medical record.   Sonia Baller Ashok Cordia, M.D. [facility] Pulmonary & Critical Care Pager:  [phone] After 3pm or if no response, call [phone] 9:45 AM 01/09/17

## 2017-01-09 NOTE — Progress Notes (Signed)
STROKE TEAM PROGRESS NOTE   SUBJECTIVE (INTERVAL HISTORY) Extensive SAH due to aneurysm. Exam unchanged today, no significant improvement. Palliative care meeting today.  OBJECTIVE Temp:  [97.7 F (36.5 C)-102.8 F (39.3 C)] 99.1 F (37.3 C) (06/13 1220) Pulse Rate:  [59-89] 64 (06/13 1200) Cardiac Rhythm: Normal sinus rhythm (06/13 0800) Resp:  [22-38] 29 (06/13 1200) BP: (111-179)/(50-66) 136/55 (06/13 1200) SpO2:  [94 %-99 %] 95 % (06/13 1200) Arterial Line BP: (112-161)/(46-61) 134/52 (06/13 1200) FiO2 (%):  [30 %-40 %] 30 % (06/13 1220) Weight:  [194 lb 3.6 oz (88.1 kg)] 194 lb 3.6 oz (88.1 kg) (06/13 0500)  CBC:  Recent Labs Lab 01/19/2017 1810  01/08/17 0442 01/09/17 0634  WBC 12.9*  < > 10.1 8.5  NEUTROABS 5.3  --   --  6.4  HGB 11.9*  < > 11.0* 10.1*  HCT 36.0  < > 33.6* 31.0*  MCV 85.9  < > 86.2 86.8  PLT 236  < > 275 236  < > = values in this interval not displayed.  Basic Metabolic Panel:   Recent Labs Lab 01/08/17 0442 01/09/17 0634  NA 159* 163*  K 3.6 2.8*  CL >130* >130*  CO2 14* 16*  GLUCOSE 132* 181*  BUN 50* 52*  CREATININE 1.13* 1.24*  CALCIUM 9.0 8.5*  MG 2.3 2.2  PHOS 4.5 4.6    Lipid Panel:     Component Value Date/Time   TRIG 414 (H) 01/05/2017 1950   HgbA1c: No results found for: HGBA1C Urine Drug Screen:     Component Value Date/Time   LABOPIA NONE DETECTED 01/15/2017 1821   COCAINSCRNUR NONE DETECTED 01/09/2017 1821   LABBENZ NONE DETECTED 12/30/2016 1821   AMPHETMU NONE DETECTED 01/22/2017 1821   THCU NONE DETECTED 01/22/2017 1821   LABBARB NONE DETECTED 01/25/2017 1821    Alcohol Level     Component Value Date/Time   ETH <5 01/25/2017 1810    IMAGING I have personally reviewed the radiological images below and agree with the radiology interpretations.  Ct Head Wo Contrast 01/09/2017 1. Massive subarachnoid hemorrhage over both convexities and within the sylvian fissures, basal cisterns and the lateral and third  ventricles. Given the distribution, an aneurysmal etiology is favored, though other entities may also cause this distribution. However, it is somewhat unclear whether there may be a component of intraparenchymal hemorrhage of the inferomedial frontal lobe. CTA of the head is recommended. 2. Effacement of the basal cisterns and crowding of the foramen magnum with early/impending herniation of the cerebellar tonsils.   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 No aneurysm filling is seen post-embolization.   TTE - Left ventricle: The cavity size was normal. There was moderate   focal basal hypertrophy. Systolic function was vigorous. The   estimated ejection fraction was in the range of 65% to 70%. Wall   motion was normal; there were no regional wall motion   abnormalities. Left ventricular diastolic function parameters   were normal. - Aortic valve: Poorly visualized. Valve area (VTI): 1.91 cm^2.   Valve area (Vmax): 1.83 cm^2. Valve area (Vmean): 1.9 cm^2.  EEG - This is an abnormal EEG due to moderate diffuse generalized slowing with poor reactivity and disorganization of the background. No seizures. There is a greater degree of slowing in the right frontal region suggesting more localized cerebral dysfunction in that area. Clinical correlation: This study is consistent with moderate  global cerebral dysfunction with possibly greater dysfunction in the right frontal region. No evidence of seizures. Clinical correlation is recommended.  TCD no vasospasm but limited study   PHYSICAL EXAM  Temp:  [97.7 F (36.5 C)-102.8 F (39.3 C)] 99.1 F (37.3 C) (06/13 1220) Pulse Rate:  [59-89] 64 (06/13 1200) Resp:  [22-38] 29 (06/13 1200) BP: (111-179)/(50-66) 136/55 (06/13 1200) SpO2:  [94 %-99 %] 95 % (06/13 1200) Arterial Line BP: (112-161)/(46-61) 134/52 (06/13 1200) FiO2  (%):  [30 %-40 %] 30 % (06/13 1220) Weight:  [194 lb 3.6 oz (88.1 kg)] 194 lb 3.6 oz (88.1 kg) (06/13 0500)  General - Well nourished, well developed, intubated on sedation. In mild resp distress  Ophthalmologic - Fundi not visualized due to small pupils.  Cardiovascular - Regular rate and rhythm. 2 + edema extremities  Neuro - intubated on sedation, not open eyes or following commands. Eyes middle position, conjugate, but slight doll's eyes on head moving. Pupils equal round, but very sluggish to light. weak corneals but positive gag. Breathing over the vent. On pain stimulation,  No response. . Increased tone bilaterally, with bilateral babinski. Sensation, coordination and gait not tested.   ASSESSMENT/PLAN [name] is a 66 y.o. female with history of HTN presenting with severe HA which progressed to unresponsive state, seizure activity and vomiting. CT showed a extensive SAH.   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 Effacement of the basal cisterns with early/impending cerebellar herniation  Cerebral angio successfully coiled ruptured ACOM aneurysm  NSG on board  TTE EF 65-70%  EEG diffuse slowing and no seizure  SCDs for VTE prophylaxis Diet NPO time specified  No antithrombotic prior to admission  Nimodipine started 01/11/2017  Ongoing aggressive stroke risk factor management  Therapy recommendations:  pending   Disposition:  Pending palliative care meeting  Respiratory failure  [facility]  Intubated in ED  managemed by CCM  Hypertensive Emergency  SBP 300 palp on scene  On cardene for BP control  SBP goal < 180   Currently BP at goal.    ? Seizure - more likely stimulation induced decorticate posturing with unsustained LUE myoclonus, resolved on today's exam  High risk for seizure  on Keppra 1000mg  bid  EEG  diffuse slowing but no seizure  Seizure precuation  Other Stroke Risk Factors  Advanced age  Other Active Problems  hypocalcemia  Hypokalemia  Hospital day # 7  This patient is critically ill and at significant risk of neurological worsening, death and care requires constant monitoring of vital signs, hemodynamics,respiratory and cardiac monitoring,review of multiple databases, neurological assessment, discussion with family, other specialists and medical decision making of high complexity.I  I spent 30 minutes of neurocritical care time in the care of this patient.  Sherri DeanAntonia Yorel Redder, MD [facility]  To contact Stroke Continuity provider, please refer to [email or web address]. After hours, contact General Neurology

## 2017-01-09 NOTE — Progress Notes (Signed)
No issues overnight.  EXAM:  BP (!) 128/51   Pulse 62   Temp 99.1 F (37.3 C) (Axillary)   Resp (!) 27   Ht 5\' 3"  (1.6 m)   Wt 88.1 kg (194 lb 3.6 oz)   LMP  (LMP Unknown)   SpO2 97%   BMI 34.41 kg/m   No eye opening to pain Pupils reactive Breathes over vent Minimal movement to noxious stimuli   IMPRESSION:  66 y.o. female SAH d# 5 s/p Acom coiling. Unchanged neurologically, minimally conscious. Prognosis remains grim.  PLAN: - Cont current supportive care  I spoke with the patients brother this am and reviewed her current condition and likely extremely poor prognosis. I reviewed the same with the patient's sister over the phone. All questions were answered.

## 2017-01-09 NOTE — Consult Note (Signed)
Consultation Note Date: 01/09/2017   Patient Name: Sherri Hale  DOB: 11-28-1950  MRN: [id number]  Age / Sex: 66 y.o., female  PCP: No primary care provider on file. Referring Physician: de Larkin Ina, [place]  Reason for Consultation: Establishing goals of care  HPI/Patient Profile: 66 y.o. female  with past medical history of HTN and pace maker placement who was admitted on 01/16/2017 with an extensive subarachnoid hemorrhage.  On admission she required intubation for acute respiratory failure.   CXR on 6/10 showed bilateral medial lobe consolidation and likely HCAP.   She remains intubated responding only to pain.  Clinical Assessment and Goals of Care:  I have reviewed medical records including EPIC notes, labs and imaging, received report from Dr. Ashok Cordia, [facility] ICU RN, and the Stroke team, assessed the patient and then met at the bedside along with HCPOA Sherri Hale  to discuss diagnosis prognosis, GOC, EOL wishes, disposition and options.  I introduced Palliative Medicine as specialized medical care for people living with serious illness. It focuses on providing relief from the symptoms and stress of a serious illness. The goal is to improve quality of life for both the patient and the family.  We discussed a brief life review of the patient.  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  Per her daughter she was not good about taking care of her health and would often skip her BP medications and make poor eating choices.  The family moved here from [place] approximately 1 year ago.  The patient received her pace maker in [place], [place] but Donato Schultz is unable to tell me when or why.    I gave Sherri Hale my understanding of her mother's prognosis.  That she is unable to breath without the support of the vent, and  we are unable to even provide a tracheostomy.  Her mother's chance of  meaningful recovery according to both CCM and Neuro is minimal to none.    We discussed her mother's increased work of breathing and that she does not look comfortable.  Sherri Hale felt the urgency to bring her family from [place] and [place] to [place] asap to see her mother and make decisions regarding extubation.  We discussed code status.  Sherri Hale became tearful but was comfortable making her mother a DNR.    Sherri Hale talked a lot about her relationship with her mother and her uncle.  Both relationships have been difficult.  We discussed cremation and memorial services.  Sherri Hale is concerned her uncle will want to "take over" her mother's services.  Questions and concerns were addressed.  Hard Choices booklet left for review. Sherri Hale will try to urge her family to come from [place] and [place] before next week.  I will touch base with her tomorrow to determine if we can schedule an additional family meeting for her out of state relatives.   Primary Decision Maker:  NEXT OF KIN daughter Sherri Hale    SUMMARY OF RECOMMENDATIONS    Code Status/Advance Care Planning:  DNR    Symptom Management:   Per primary team.  Agree with fentanyl for dyspnea.  Additional Recommendations (Limitations, Scope, Preferences):  Full Scope Treatment  Psycho-social/Spiritual:   Desire for further Chaplaincy support: yes  Prognosis:   Hours - Days  Discharge Planning: Anticipated Hospital Death      Primary Diagnoses: Present on Admission: . Subarachnoid hemorrhage due to ruptured aneurysm ([place])   I have reviewed the medical record, interviewed the patient and family, and examined the patient. The following aspects are pertinent.  Past Medical History:  Diagnosis Date  . Hypertension    Social History   Social History  . Marital status: N/A    Spouse name: N/A  . Number of children: N/A  . Years of education: N/A   Social History Main Topics  . Smoking status: Never Smoker  . Smokeless tobacco:  Never Used  . Alcohol use No  . Drug use: No  . Sexual activity: Not Asked   Other Topics Concern  . None   Social History Narrative  . None   History reviewed. No pertinent family history. Scheduled Meds: . chlorhexidine gluconate (MEDLINE KIT)  15 mL Mouth Rinse BID  . Chlorhexidine Gluconate Cloth  6 each Topical Daily  . feeding supplement (PRO-STAT SUGAR FREE 64)  30 mL Per Tube TID  . feeding supplement (VITAL HIGH PROTEIN)  1,000 mL Per Tube Q24H  . free water  100 mL Per Tube Q8H  . hydrochlorothiazide  25 mg Per Tube Daily  . insulin aspart  0-9 Units Subcutaneous Q4H  . insulin glargine  10 Units Subcutaneous Daily  . losartan  100 mg Per Tube Daily  . mouth rinse  15 mL Mouth Rinse 10 times per day  . multivitamin  15 mL Per Tube Daily  . NiMODipine  60 mg Per Tube Q4H  . pantoprazole (PROTONIX) IV  40 mg Intravenous QHS  . sodium chloride flush  10-40 mL Intracatheter Q12H   Continuous Infusions: . sodium chloride 10 mL/hr at 01/09/17 0700  . ampicillin-sulbactam (UNASYN) IV Stopped (01/09/17 0656)  . dexmedetomidine 0.598 mcg/kg/hr (01/09/17 0700)  . levETIRAcetam Stopped (01/09/17 0641)  . niCARDipine 15 mg/hr (01/09/17 0700)   PRN Meds:.acetaminophen (TYLENOL) oral liquid 160 mg/5 mL, fentaNYL (SUBLIMAZE) injection, labetalol, loperamide, ondansetron (ZOFRAN) IV, sodium chloride flush No Known Allergies Review of Systems patient intubated and sedated  Physical Exam well developed female currently intubated, non-responsive to voice or light touch.  On precedex and cardene gtt CV regular resp intubated with increased work of breathing  Abdomen soft Extremities with 2+ swelling in the dorsum of her hands.  Vital Signs: BP (!) 128/51   Pulse 62   Temp 99.1 F (37.3 C) (Axillary)   Resp (!) 27   Ht _0  (1.6 m)   Wt 88.1 kg (194 lb 3.6 oz)   LMP  (LMP Unknown)   SpO2 97%   BMI 34.41 kg/m  Pain Assessment: CPOT   Pain Score: Asleep   SpO2: SpO2:  97 % O2 Device:SpO2: 97 % O2 Flow Rate: .O2 Flow Rate (L/min): 15 L/min  IO: Intake/output summary:  Intake/Output Summary (Last 24 hours) at 01/09/17 0850 Last data filed at 01/09/17 0700  Gross per 24 hour  Intake          4035.09 ml  Output             2850 ml  Net          1185.09 ml  LBM: Last BM Date: 01/08/17 Baseline Weight: Weight: 81.6 kg (180 lb) Most recent weight: Weight: 88.1 kg (194 lb 3.6 oz)     Palliative Assessment/Data:   Flowsheet Rows     Most Recent Value  Intake Tab  Referral Department  Critical care  Unit at Time of Referral  ICU  Palliative Care Primary Diagnosis  Neurology  Date Notified  01/08/17  Palliative Care Type  New Palliative care  Reason for referral  Clarify Goals of Care  Date of Admission  01/01/2017  Date first seen by Palliative Care  01/08/17  # of days IP prior to Palliative referral  6  Clinical Assessment  Palliative Performance Scale Score  10%  Dyspnea Max Last 24 Hours  6  Psychosocial & Spiritual Assessment  Palliative Care Outcomes      Time In: 8:30 Time Out: 9:00 Time In: 10:30 Time Out: 11:10 Time Total: 70 min. Greater than 50%  of this time was spent counseling and coordinating care related to the above assessment and plan.  Signed by: Imogene Burn, PA-C Palliative Medicine Pager: [phone]  Please contact Palliative Medicine Team phone at [phone] for questions and concerns.  For individual provider: See Shea Evans

## 2017-01-09 NOTE — Progress Notes (Signed)
CRITICAL VALUE ALERT  Critical Value:  Na 163; CL >130  Date & Time Notied:  01/09/2017 @ 0800  Provider Notified: Dr. Jamison NeighborNestor - In ICU  Orders Received/Actions taken: Free water increased.

## 2017-01-10 ENCOUNTER — Inpatient Hospital Stay (HOSPITAL_COMMUNITY): Payer: Medicare HMO

## 2017-01-10 LAB — CBC WITH DIFFERENTIAL/PLATELET
BASOS ABS: 0 10*3/uL (ref 0.0–0.1)
BASOS PCT: 0 %
EOS ABS: 0.1 10*3/uL (ref 0.0–0.7)
Eosinophils Relative: 1 %
HCT: 31.5 % — ABNORMAL LOW (ref 36.0–46.0)
Hemoglobin: 10 g/dL — ABNORMAL LOW (ref 12.0–15.0)
LYMPHS ABS: 1.4 10*3/uL (ref 0.7–4.0)
Lymphocytes Relative: 16 %
MCH: 28.2 pg (ref 26.0–34.0)
MCHC: 31.7 g/dL (ref 30.0–36.0)
MCV: 89 fL (ref 78.0–100.0)
MONO ABS: 0.9 10*3/uL (ref 0.1–1.0)
Monocytes Relative: 10 %
NEUTROS ABS: 6.3 10*3/uL (ref 1.7–7.7)
Neutrophils Relative %: 73 %
PLATELETS: 242 10*3/uL (ref 150–400)
RBC: 3.54 MIL/uL — ABNORMAL LOW (ref 3.87–5.11)
RDW: 15.5 % (ref 11.5–15.5)
WBC: 8.7 10*3/uL (ref 4.0–10.5)

## 2017-01-10 LAB — GLUCOSE, CAPILLARY
GLUCOSE-CAPILLARY: 160 mg/dL — AB (ref 65–99)
GLUCOSE-CAPILLARY: 168 mg/dL — AB (ref 65–99)
GLUCOSE-CAPILLARY: 196 mg/dL — AB (ref 65–99)
Glucose-Capillary: 130 mg/dL — ABNORMAL HIGH (ref 65–99)
Glucose-Capillary: 190 mg/dL — ABNORMAL HIGH (ref 65–99)
Glucose-Capillary: 193 mg/dL — ABNORMAL HIGH (ref 65–99)
Glucose-Capillary: 206 mg/dL — ABNORMAL HIGH (ref 65–99)

## 2017-01-10 LAB — RENAL FUNCTION PANEL
Albumin: 2.1 g/dL — ABNORMAL LOW (ref 3.5–5.0)
BUN: 57 mg/dL — ABNORMAL HIGH (ref 6–20)
CALCIUM: 8.4 mg/dL — AB (ref 8.9–10.3)
CO2: 16 mmol/L — AB (ref 22–32)
CREATININE: 1.28 mg/dL — AB (ref 0.44–1.00)
Chloride: >130 mmol/L (ref 101–111)
GFR calc Af Amer: 49 mL/min — ABNORMAL LOW (ref >60)
GFR calc non Af Amer: 43 mL/min — ABNORMAL LOW (ref >60)
GLUCOSE: 194 mg/dL — AB (ref 65–99)
PHOSPHORUS: 4.5 mg/dL (ref 2.5–4.6)
Potassium: 2.6 mmol/L — CL (ref 3.5–5.1)
SODIUM: 161 mmol/L — AB (ref 135–145)

## 2017-01-10 LAB — URINE CULTURE: CULTURE: NO GROWTH

## 2017-01-10 LAB — MAGNESIUM: MAGNESIUM: 2.3 mg/dL (ref 1.7–2.4)

## 2017-01-10 MED ORDER — FENTANYL 2500MCG IN NS 250ML (10MCG/ML) PREMIX INFUSION
40.0000 ug/h | INTRAVENOUS | Status: DC
  Administered 2017-01-10 – 2017-01-12 (×2): 40 ug/h via INTRAVENOUS
  Filled 2017-01-10 (×2): qty 250

## 2017-01-10 MED ORDER — POTASSIUM CHLORIDE 20 MEQ/15ML (10%) PO SOLN
60.0000 meq | Freq: Once | ORAL | Status: AC
  Administered 2017-01-10: 60 meq
  Filled 2017-01-10: qty 45

## 2017-01-10 MED ORDER — FREE WATER
300.0000 mL | Status: DC
  Administered 2017-01-10 – 2017-01-13 (×12): 300 mL

## 2017-01-10 MED ORDER — DEXTROSE-NACL 5-0.45 % IV SOLN
INTRAVENOUS | Status: DC
  Administered 2017-01-10: 18:00:00 via INTRAVENOUS
  Administered 2017-01-11: 1000 mL via INTRAVENOUS
  Administered 2017-01-11 – 2017-01-13 (×4): via INTRAVENOUS

## 2017-01-10 MED ORDER — POTASSIUM CHLORIDE 10 MEQ/50ML IV SOLN
10.0000 meq | INTRAVENOUS | Status: AC
  Administered 2017-01-10 (×4): 10 meq via INTRAVENOUS
  Filled 2017-01-10 (×4): qty 50

## 2017-01-10 MED ORDER — FENTANYL BOLUS VIA INFUSION
50.0000 ug | INTRAVENOUS | Status: DC | PRN
  Administered 2017-01-12: 100 ug via INTRAVENOUS
  Administered 2017-01-13: 50 ug via INTRAVENOUS
  Filled 2017-01-10: qty 100

## 2017-01-10 MED ORDER — AMLODIPINE 1 MG/ML ORAL SUSPENSION
10.0000 mg | Freq: Every day | ORAL | Status: DC
  Administered 2017-01-10: 10 mg
  Filled 2017-01-10 (×4): qty 10

## 2017-01-10 NOTE — Progress Notes (Signed)
eLink Physician-Brief Progress Note Patient Name: Sherri Hale DOB: Jul 05, 1951 MRN: [id number]   Date of Service  01/10/2017  HPI/Events of Note    eICU Interventions  Hypokalemia, repleted      Intervention Category Intermediate Interventions: Electrolyte abnormality - evaluation and management  Max FickleDouglas McQuaid 01/10/2017, 6:15 AM

## 2017-01-10 NOTE — Progress Notes (Signed)
No issues overnight.   EXAM:  BP (!) 130/52   Pulse 62   Temp 98.8 F (37.1 C) (Oral)   Resp (!) 31   Ht 5\' 3"  (1.6 m)   Wt 89.9 kg (198 lb 3.1 oz)   LMP  (LMP Unknown)   SpO2 95%   BMI 35.11 kg/m   No eye opening to pain Pupils reactive Breathes over vent No movement to stimuli   IMPRESSION:  66 y.o. female SAH d# 6 s/p Acom coiling, remains minimally conscious without sign of improvement over the last week. Prognosis is grim. Has been made DNR by her daughter.  PLAN: - Cont current care, pts daughter/brother to decide about palliative care/terminal wean

## 2017-01-10 NOTE — Progress Notes (Signed)
Nutrition Follow-up  DOCUMENTATION CODES:   Obesity unspecified  INTERVENTION:   Vital High Protein @ 30 ml/hr (720 ml/day) via OG tube 30 ml Prostat TID MVI daily  Provides: 1020 kcal, 108 grams protein, and 601 ml free water.   Free water flushes: 300 ml free water every 4 hours Total free water: 2401 ml   NUTRITION DIAGNOSIS:   Inadequate oral intake related to inability to eat as evidenced by NPO status. Ongoing.   GOAL:   Provide needs based on ASPEN/SCCM guidelines Met.   MONITOR:   Vent status, I & O's, TF tolerance  ASSESSMENT:   Pt admitted for ACOM aneurysm in setting of severe HTN with bilateral SAH s/p coiling 6/7.   Pt discussed during ICU rounds and with RN.   Pt remains on vent support. Noted plan for one-way extubation Sat with family.  Labs reviewed: Na 161 (H), K+ 2.6 (L), Cl > 130 (H)  Diet Order:  Diet NPO time specified  Skin:  Reviewed, no issues  Last BM:  475 ml stool x 24 hrs via rectal tube  Height:   Ht Readings from Last 1 Encounters:  01/07/2017 _0  (1.6 m)    Weight:   Wt Readings from Last 1 Encounters:  01/10/17 198 lb 3.1 oz (89.9 kg)    Ideal Body Weight:  52.2 kg  BMI:  Body mass index is 35.11 kg/m.  Estimated Nutritional Needs:   Kcal:  1000-1200  Protein:  >/= 105 grams  Fluid:  > 1.5 L/day  EDUCATION NEEDS:   No education needs identified at this time  [place], [place], [place] Pager [phone] After Hours Pager

## 2017-01-10 NOTE — Progress Notes (Signed)
STROKE TEAM PROGRESS NOTE   SUBJECTIVE (INTERVAL HISTORY) Extensive SAH due to aneurysm. Exam unchanged today, no significant improvement. Palliative care is involved. Spoke to brother at bedside and sister in [place] over the phone. They understand prognosis and will extubate Saturday if family can all get there.  OBJECTIVE Temp:  [97.5 F (36.4 C)-99.6 F (37.6 C)] 99 F (37.2 C) (06/14 1600) Pulse Rate:  [60-86] 60 (06/14 1800) Cardiac Rhythm: Normal sinus rhythm (06/14 0800) Resp:  [24-36] 29 (06/14 1800) BP: (106-162)/(46-81) 121/58 (06/14 1800) SpO2:  [93 %-97 %] 96 % (06/14 1800) Arterial Line BP: (117-158)/(53-72) 142/60 (06/14 1800) FiO2 (%):  [30 %] 30 % (06/14 1525) Weight:  [198 lb 3.1 oz (89.9 kg)] 198 lb 3.1 oz (89.9 kg) (06/14 0409)  CBC:   Recent Labs Lab 01/09/17 0634 01/10/17 0500  WBC 8.5 8.7  NEUTROABS 6.4 6.3  HGB 10.1* 10.0*  HCT 31.0* 31.5*  MCV 86.8 89.0  PLT 236 242    Basic Metabolic Panel:   Recent Labs Lab 01/09/17 0634 01/10/17 0500  NA 163* 161*  K 2.8* 2.6*  CL >130* >130*  CO2 16* 16*  GLUCOSE 181* 194*  BUN 52* 57*  CREATININE 1.24* 1.28*  CALCIUM 8.5* 8.4*  MG 2.2 2.3  PHOS 4.6 4.5    Lipid Panel:     Component Value Date/Time   TRIG 414 (H) 01/05/2017 1950   HgbA1c: No results found for: HGBA1C Urine Drug Screen:     Component Value Date/Time   LABOPIA NONE DETECTED 01/19/2017 1821   COCAINSCRNUR NONE DETECTED 01/24/2017 1821   LABBENZ NONE DETECTED 01/04/2017 1821   AMPHETMU NONE DETECTED 01/11/2017 1821   THCU NONE DETECTED 01/23/2017 1821   LABBARB NONE DETECTED 01/16/2017 1821    Alcohol Level     Component Value Date/Time   ETH <5 01/26/2017 1810    IMAGING I have personally reviewed the radiological images below and agree with the radiology interpretations.  Ct Head Wo Contrast 01/03/2017 1. Massive subarachnoid hemorrhage over both convexities and within the sylvian fissures, basal cisterns and the  lateral and third ventricles. Given the distribution, an aneurysmal etiology is favored, though other entities may also cause this distribution. However, it is somewhat unclear whether there may be a component of intraparenchymal hemorrhage of the inferomedial frontal lobe. CTA of the head is recommended. 2. Effacement of the basal cisterns and crowding of the foramen magnum with early/impending herniation of the cerebellar tonsils.   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 No aneurysm filling is seen post-embolization.   TTE - Left ventricle: The cavity size was normal. There was moderate   focal basal hypertrophy. Systolic function was vigorous. The   estimated ejection fraction was in the range of 65% to 70%. Wall   motion was normal; there were no regional wall motion   abnormalities. Left ventricular diastolic function parameters   were normal. - Aortic valve: Poorly visualized. Valve area (VTI): 1.91 cm^2.   Valve area (Vmax): 1.83 cm^2. Valve area (Vmean): 1.9 cm^2.  EEG - This is an abnormal EEG due to moderate diffuse generalized slowing with poor reactivity and disorganization of the background. No seizures. There is a greater degree of slowing in the right frontal region suggesting more localized cerebral dysfunction in that area. Clinical correlation: This study is consistent with moderate global cerebral dysfunction with possibly greater dysfunction in the right frontal region.  No evidence of seizures. Clinical correlation is recommended.  TCD no vasospasm but limited study   PHYSICAL EXAM  Temp:  [97.5 F (36.4 C)-99.6 F (37.6 C)] 99 F (37.2 C) (06/14 1600) Pulse Rate:  [60-86] 60 (06/14 1800) Resp:  [24-36] 29 (06/14 1800) BP: (106-162)/(46-81) 121/58 (06/14 1800) SpO2:  [93 %-97 %] 96 % (06/14 1800) Arterial Line BP: (117-158)/(53-72) 142/60 (06/14  1800) FiO2 (%):  [30 %] 30 % (06/14 1525) Weight:  [198 lb 3.1 oz (89.9 kg)] 198 lb 3.1 oz (89.9 kg) (06/14 0409)  General - Well nourished, well developed, intubated on sedation. In mild resp distress  Ophthalmologic - Fundi not visualized due to small pupils.  Cardiovascular - Regular rate and rhythm. 2 + edema extremities  Neuro - intubated on sedation, not open eyes or following commands. Eyes middle position, conjugate, but slight doll's eyes on head moving. Pupils equal round, but very sluggish to light. weak corneals but positive gag. Breathing over the vent. On pain stimulation,  No response. . Increased tone bilaterally, with bilateral babinski. Sensation, coordination and gait not tested.   ASSESSMENT/PLAN Ms. Thom Chimeslicia Correa Basto is a 66 y.o. female with history of HTN presenting with severe HA which progressed to unresponsive state, seizure activity and vomiting. CT showed a extensive SAH.   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 Effacement of the basal cisterns with early/impending cerebellar herniation  Cerebral angio successfully coiled ruptured ACOM aneurysm  NSG on board  TTE EF 65-70%  EEG diffuse slowing and no seizure  SCDs for VTE prophylaxis Diet NPO time specified  No antithrombotic prior to admission  Nimodipine started 01/04/2017  Ongoing aggressive stroke risk factor management  Therapy recommendations:  pending   Disposition:  Pending palliative care meeting  Respiratory failure  [facility]  Intubated in ED  managemed by CCM  Hypertensive Emergency  SBP 300 palp on scene  On cardene for BP control  SBP goal < 180   Currently BP at goal.    ? Seizure - more likely stimulation induced decorticate posturing with unsustained LUE myoclonus, resolved on today's exam  High risk for seizure  on Keppra 1000mg   bid  EEG diffuse slowing but no seizure  Seizure precuation  Other Stroke Risk Factors  Advanced age  Other Active Problems  hypocalcemia  Hypokalemia  Hospital day # 8  This patient is critically ill and at significant risk of neurological worsening, death and care requires constant monitoring of vital signs, hemodynamics,respiratory and cardiac monitoring,review of multiple databases, neurological assessment, discussion with family, other specialists and medical decision making of high complexity.I  I spent 30 minutes of neurocritical care time in the care of this patient.  Naomie DeanAntonia Allan Bacigalupi, MD [facility]  To contact Stroke Continuity provider, please refer to [email or web address]. After hours, contact General Neurology

## 2017-01-10 NOTE — Progress Notes (Signed)
Contacted Marianne York, GeorgiNew LisbonaPA and notified Magda PaganiniAudrey, elink RN re: abd xray results. Stopped tube feeds at this time per Marianne's recommendatio. Awaiting further orders from MD.

## 2017-01-10 NOTE — Progress Notes (Addendum)
Daily Progress Note   Patient Name: Sherri Hale       Date: 01/10/2017 DOB: 08-19-1950  Age: 66 y.o. MRN#: [id number] Attending Physician: Corrie Dandy, [place] Primary Care Physician: No primary care provider on file. Admit Date: 12/28/2016  Reason for Consultation/Follow-up: Establishing goals of care, Pain control and Psychosocial/spiritual support  Subjective: Patient intubated and sedated. Spoke with Mayra and her husband on the phone.  We discussed the patient's dyspnea and discomfort.  We discussed the risks of respiratory or BP depression and altered mental status associated with pain medications.  Mayra and her husband asked that the medical team work to make her mother more comfortable despite the risks associated with pain medications.  The patient's family will be coming in from [place] and [place].  Per Mayra they should all be here on Sunday.  She asks if the medical team could plan to meet with the extended family on Sunday to discussed her mother's predicament.  Discussed patient with Jerene Pitch, RN - the patient's stomach appears more distended.  Will order a portable abdominal xray.   Patient Profile/HPI: 35 yof pmh HTN, pace maker, h/o medical non-compliance.  Admitted with extensive SAH.  Now s/p ACOM coiling.  Currently intubated, sedated, receiving precedex and cardene gtt.   Per stroke team note there is concern for early or impending cerebellar herniation.  Prognosis very poor.   Length of Stay: 8  Current Medications: Scheduled Meds:  . amLODipine  10 mg Per Tube Daily  . chlorhexidine gluconate (MEDLINE KIT)  15 mL Mouth Rinse BID  . Chlorhexidine Gluconate Cloth  6 each Topical Daily  . feeding supplement (PRO-STAT SUGAR FREE 64)  30 mL Per Tube TID  .  feeding supplement (VITAL HIGH PROTEIN)  1,000 mL Per Tube Q24H  . free water  300 mL Per Tube Q4H  . insulin aspart  0-9 Units Subcutaneous Q4H  . insulin glargine  10 Units Subcutaneous Daily  . losartan  100 mg Per Tube Daily  . mouth rinse  15 mL Mouth Rinse 10 times per day  . multivitamin  15 mL Per Tube Daily  . NiMODipine  60 mg Per Tube Q4H  . pantoprazole (PROTONIX) IV  40 mg Intravenous QHS  . sodium chloride flush  10-40 mL Intracatheter Q12H  Continuous Infusions: . sodium chloride 10 mL/hr at 01/10/17 0700  . ciprofloxacin Stopped (01/10/17 1105)  . dexmedetomidine 0.5 mcg/kg/hr (01/10/17 0942)  . fentaNYL infusion INTRAVENOUS    . levETIRAcetam Stopped (01/10/17 0527)  . niCARDipine 15 mg/hr (01/10/17 1222)    PRN Meds: acetaminophen (TYLENOL) oral liquid 160 mg/5 mL, fentaNYL, fentaNYL (SUBLIMAZE) injection, labetalol, loperamide, ondansetron (ZOFRAN) IV, sodium chloride flush  Physical Exam      Well delveloped female.  Non-responsive to voice or light touch.  Intubated. Mildly diaphoretic CV rrr resp increased work of breathing Abdomen distended and firm but not tight   Vital Signs: BP 125/70   Pulse 86   Temp 98.7 F (37.1 C) (Axillary)   Resp (!) 36   Ht 5' 3"  (1.6 m)   Wt 89.9 kg (198 lb 3.1 oz)   LMP  (LMP Unknown)   SpO2 93%   BMI 35.11 kg/m  SpO2: SpO2: 93 % O2 Device: O2 Device: Ventilator O2 Flow Rate: O2 Flow Rate (L/min): 15 L/min  Intake/output summary:  Intake/Output Summary (Last 24 hours) at 01/10/17 1308 Last data filed at 01/10/17 0744  Gross per 24 hour  Intake          3519.07 ml  Output             2325 ml  Net          1194.07 ml   LBM: Last BM Date: 01/09/17 Baseline Weight: Weight: 81.6 kg (180 lb) Most recent weight: Weight: 89.9 kg (198 lb 3.1 oz)       Palliative Assessment/Data:    Flowsheet Rows     Most Recent Value  Intake Tab  Referral Department  Critical care  Unit at Time of Referral  ICU    Palliative Care Primary Diagnosis  Neurology  Date Notified  01/08/17  Palliative Care Type  New Palliative care  Reason for referral  Clarify Goals of Care  Date of Admission  01/21/2017  Date first seen by Palliative Care  01/08/17  # of days IP prior to Palliative referral  6  Clinical Assessment  Palliative Performance Scale Score  10%  Dyspnea Max Last 24 Hours  6  Psychosocial & Spiritual Assessment  Palliative Care Outcomes      Patient Active Problem List   Diagnosis Date Noted  . Palliative care by specialist   . DNR (do not resuscitate)   . 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 SAH (subarachnoid hemorrhage) ([place]) 01/06/2017  . Subarachnoid hemorrhage due to ruptured aneurysm ([place]) 01/06/2017  . Acute respiratory failure [facility])     Palliative Care Plan    Recommendations/Plan:  Will start fentanyl gtt with prn bolus for increased work of breathing / discomfort  Continue current care.  Family meeting to be scheduled on Sunday 6/17 with PCCM / PMT  abd xray for distension.   Goals of Care and Additional Recommendations:  The goal is to allow family to arrive from [place] and [place] to see her on Sunday and understand the situation.  Mayra (HCPOA) does not want to make the decision for extubation / comfort care in a vacuum.  She needs her family to understand.  Code Status:  DNR  Prognosis:   Hours - Days   Discharge Planning:  Anticipated Hospital Death  Care plan was discussed with bedside RN Jerene Pitch and [facility] Mayra  Thank you for allowing the Palliative Medicine Team to assist in the care of this patient.  Total time  spent:  35 min.     Greater than 50%  of this time was spent counseling and coordinating care related to the above assessment and plan.  Imogene Burn, PA-C Palliative Medicine  Please contact Palliative MedicineTeam phone at [phone] for questions and concerns between 7 am - 7 pm.   Please see AMION for individual provider pager  numbers.

## 2017-01-10 NOTE — Progress Notes (Signed)
[facility] Attending Note  ADMISSION DATE:  01/21/2017  CONSULTATION DATE:  01/24/2017  REFERRING MD:  Dr. Oleta Mouse EDP  CHIEF COMPLAINT:  SAH  Presenting HPI:  66 y.o.  female with past medical history of hypertension who presented to [facility] emergency department 6/6 via EMS after collapsing at [facility]. While at the [facility] she was complaining of severe headache and then suddenly became unresponsive. She developed altered mental status and vomited.  Intubated for airway protection.  Found to be profoundly hypertensive with systolic blood pressures in the 230s. In the emergency department she was started on nicardipine infusion and loaded with IV Keppra. She was taken for a CT scan of the head which showed extensive subarachnoid hemorrhage. She was evaluated by Neurosurgery.   Ultimately went to Neuro IR for coiling.   Subjective:  No acute events overnight. Potassium replaced this morning.   Review of Systems:  Unable to obtain given intubation & altered mentation.   Vent Mode: PRVC FiO2 (%):  [30 %] 30 % Set Rate:  [20 bmp] 20 bmp Vt Set:  [490 mL] 490 mL PEEP:  [5 cmH20] 5 cmH20 Plateau Pressure:  [19 cmH20-35 cmH20] 19 cmH20  Temp:  [97.5 F (36.4 C)-99.6 F (37.6 C)] 98.8 F (37.1 C) (06/14 0816) Pulse Rate:  [60-81] 78 (06/14 0839) Resp:  [27-36] 32 (06/14 0839) BP: (112-162)/(46-67) 146/63 (06/14 0700) SpO2:  [94 %-97 %] 94 % (06/14 0839) Arterial Line BP: (107-158)/(44-64) 158/64 (06/14 0700) FiO2 (%):  [30 %] 30 % (06/14 0839) Weight:  [198 lb 3.1 oz (89.9 kg)] 198 lb 3.1 oz (89.9 kg) (06/14 0409)  General:  No distress. Daughter and brother at bedside.  Integument:  Warm & dry. No rash on exposed skin.  Extremities:  No cyanosis or clubbing.  HEENT:  Moist mucus membranes. No scleral injection or icterus. Endotracheal tube in place.  Cardiovascular:  Regular rate & rhythm. Diffuse anasarca. Pulmonary:  Coarse breath sounds bilaterally.  Symmetric chest wall expansion on ventilator. Abdomen: Soft. Normal bowel sounds. Protuberant. Normal bowel sounds. Neurological: No withdrawal to pain. Pupils pinpoint and symmetric. No spontaneous movements.  LINES/TUBES: OETT 6/6 >>> RUE PICC 6/9 >>> Foley 6/6 >>> OGT 6/6 >>> L Rad Art Line 6/6 >>> PIV  CBC Latest Ref Rng & Units 01/10/2017 01/09/2017 01/08/2017  WBC 4.0 - 10.5 K/uL 8.7 8.5 10.1  Hemoglobin 12.0 - 15.0 g/dL 10.0(L) 10.1(L) 11.0(L)  Hematocrit 36.0 - 46.0 % 31.5(L) 31.0(L) 33.6(L)  Platelets 150 - 400 K/uL 242 236 275    BMP Latest Ref Rng & Units 01/10/2017 01/09/2017 01/08/2017  Glucose 65 - 99 mg/dL 194(H) 181(H) 132(H)  BUN 6 - 20 mg/dL 57(H) 52(H) 50(H)  Creatinine 0.44 - 1.00 mg/dL 1.28(H) 1.24(H) 1.13(H)  Sodium 135 - 145 mmol/L 161(HH) 163(HH) 159(H)  Potassium 3.5 - 5.1 mmol/L 2.6(LL) 2.8(L) 3.6  Chloride 101 - 111 mmol/L >130(HH) >130(HH) >130(HH)  CO2 22 - 32 mmol/L 16(L) 16(L) 14(L)  Calcium 8.9 - 10.3 mg/dL 8.4(L) 8.5(L) 9.0   Hepatic Function Latest Ref Rng & Units 01/10/2017 01/09/2017 01/04/2017  Total Protein 6.5 - 8.1 g/dL - - 5.7(L)  Albumin 3.5 - 5.0 g/dL 2.1(L) 2.0(L) 3.3(L)  AST 15 - 41 U/L - - 39  ALT 14 - 54 U/L - - 67(H)  Alk Phosphatase 38 - 126 U/L - - 94  Total Bilirubin 0.3 - 1.2 mg/dL - - 1.4(H)    IMAGING/STUDIES: UDS 6/6:  Negative  CT HEAD W/O 6/6: IMPRESSION: 1. Massive subarachnoid hemorrhage over both convexities and within the sylvian fissures, basal cisterns and the lateral and third ventricles. Given the distribution, an aneurysmal etiology is favored, though other entities may also cause this distribution. However, it is somewhat unclear whether there may be a component of intraparenchymal hemorrhage of the inferomedial frontal lobe. CTA of the head is recommended. 2. Effacement of the basal cisterns and crowding of the foramen magnum with early/impending herniation of the cerebellar tonsils. PORT CXR 6/12:  Previously  reviewed by me. Endotracheal tube in good position. Enteric feeding tube coursing below diaphragm. Persistent lower lobe and perihilar patchy opacification with some slight worsening of silhouetting of bilateral hemidiaphragms.  MICROBIOLOGY: MRSA PCR 6/6:  Negative  Tracheal Aspirate Culture 6/9: MSSA & Klebsiella (sensitive to Unasyn) Blood Cultures x2 6/12 >>> Tracheal Aspirate Culture 6/12 >>> Rare yeast Urine Culture 6/12:  Negative   ANTIBIOTICS: Unasyn 6/10 - 6/13 Cipro 6/13 >>>  SIGNIFICANT EVENTS: 06/06 - Admit 06/07 - ACOM aneurysm coiling 06/09 - Antibiotics empirically started for increased secretions 06/12 - Febrile >> recultured 06/13 - Unasyn switched to Cipro given sensitivities & worsening hypernatremia. FWD 5.4 L>>started Free Water 200cc VT q4hr  ASSESSMENT/PLAN:  66 y.o. female with acute encephalopathy secondary to subarachnoid hemorrhage. Patient status post aneurysm coiling. Still no sign of neurologic recovery.   1. Acute encephalopathy: Likely multifactorial. Unable to wean Precedex. Limiting sedatives. 2. Subarachnoid hemorrhage: Management per neurology. At his most aneurysm coiling. Continuing Keppra and nimodipine. 3. Acute hypoxic respiratory failure: Multifactorial and ongoing likely secondary to healthcare associated pneumonia. Continuing full ventilator support. 4. Klebsiella and MSSA healthcare associated pneumonia: Continuing Cipro day #5/14 total antibiotics. 5. Hypernatremia/hyperchloremia: Improving. Increasing free water to 300 mL via tube every 4 hours. Trending electrolytes daily. 6. Hypertensive emergency: Discontinuing hydrochlorothiazide. Continuing losartan. Starting Norvasc 10 mg daily. Weaning Cardene drip. Monitoring vitals per unit protocol. 7. Fever of unknown origin: Improving. Awaiting finalization of repeat cultures on 6/12. 8. Anemia: Multifactorial. No signs of active bleeding at this time. Trending cell counts daily with  CBC. 9. Hyperglycemia: Continuing Lantus daily & Accu-Cheks every 4 hours with sliding scale insulin. Glucose control. 10. Metabolic acidosis: Stable. Secondary to hyperchloremia. Monitoring like lites daily.   Prophylaxis:  SCDs & Protonix IV daily at bedtime. Diet:  Nothing by mouth. Continuing tube feedings for now. Code Status: DO NOT RESUSCITATE as per discussion with palliative medicine yesterday. Disposition:  Plan to transition to full comfort care and one way extubation on Saturday (6/16) after family arrival. Family Update:  Brother and daughter updated at bedside today.  I have spent a total of 31 minutes of critical care time today caring for the patient, discussing plan of care with family members at bedside, and reviewing the patient's electronic medical record.   Sonia Baller Ashok Cordia, M.D. [facility]monary [facility] Critical Care Pager:  [phone] After 3pm or if no response, call [phone] 9:25 AM 01/10/17

## 2017-01-10 NOTE — Progress Notes (Signed)
eLink Physician-Brief Progress Note Patient Name: Sherri Hale DOB: [birth year] MRN: [id number]   Date of Service  01/10/2017  HPI/Events of Note  KUB shows SBO vs ileus. Patient is on Lantus. Na+ = 161.  eICU Interventions  Will order: 1. Hold tube feedings. 2. Gastric tube to LIS. 3. D5 0.45 NaCl to run IV at 75 mL/hour.      Intervention Category Intermediate Interventions: Diagnostic test evaluation  Sommer,Steven Dennard Nipugene 01/10/2017, 5:18 PM

## 2017-01-10 NOTE — Care Management Note (Signed)
Case Management Note Original Note created by Lawerance Sabalebbie Swist  Patient Details  Name: Sherri Hale MRN: [id number] Date of Birth: Aug 04, 1950  Subjective/Objective:                 [facility] HTN proceeding SAH. Plan for diagnostic angiogram and treatment of any identified aneurysm. Intubated. Plan and GOC will be pending results of angiogram.    Action/Plan:  CM will continue to follow.  Expected Discharge Date:                  Expected Discharge Plan:     In-House Referral:     Discharge planning Services  CM Consult  Post Acute Care Choice:    Choice offered to:     DME Arranged:    DME Agency:     HH Arranged:    HH Agency:     Status of Service:  In process, will continue to follow  If discussed at Long Length of Stay Meetings, dates discussed:    Additional Comments: 01/10/2017  Discussed in LOS 6/14 - pt remains appropriate for continued stay.  GOC discussions initiated - planned family meeting with Palliative Sunday 6/17  01/04/17 Pt remains intubated.   Cherylann ParrClaxton, Arpi Diebold S, RN 01/10/2017, 2:57 PM

## 2017-01-10 NOTE — Progress Notes (Signed)
No charge note.  Appreciate call from bedside RN.  Abd xray resulted.  Shows possible small bowel ileus or partial SBO.  I recommended tube feeds be held for now and a follow up xray be done tomorrow.  Will defer to attending team.   Algis DownsMarianne York, PA-C Palliative Medicine Pager: [phone]

## 2017-01-11 ENCOUNTER — Ambulatory Visit (HOSPITAL_COMMUNITY): Payer: Medicare HMO

## 2017-01-11 ENCOUNTER — Inpatient Hospital Stay (HOSPITAL_COMMUNITY): Payer: Medicare HMO

## 2017-01-11 DIAGNOSIS — Z515 Encounter for palliative care: Secondary | ICD-10-CM

## 2017-01-11 DIAGNOSIS — Z7189 Other specified counseling: Secondary | ICD-10-CM

## 2017-01-11 DIAGNOSIS — I609 Nontraumatic subarachnoid hemorrhage, unspecified: Secondary | ICD-10-CM

## 2017-01-11 LAB — CBC WITH DIFFERENTIAL/PLATELET
BASOS PCT: 0 %
Basophils Absolute: 0 10*3/uL (ref 0.0–0.1)
EOS ABS: 0.2 10*3/uL (ref 0.0–0.7)
EOS PCT: 2 %
HCT: 32.8 % — ABNORMAL LOW (ref 36.0–46.0)
HEMOGLOBIN: 10.4 g/dL — AB (ref 12.0–15.0)
Lymphocytes Relative: 16 %
Lymphs Abs: 1.8 10*3/uL (ref 0.7–4.0)
MCH: 28.2 pg (ref 26.0–34.0)
MCHC: 31.7 g/dL (ref 30.0–36.0)
MCV: 88.9 fL (ref 78.0–100.0)
Monocytes Absolute: 1.1 10*3/uL — ABNORMAL HIGH (ref 0.1–1.0)
Monocytes Relative: 10 %
NEUTROS PCT: 72 %
Neutro Abs: 8.3 10*3/uL — ABNORMAL HIGH (ref 1.7–7.7)
PLATELETS: 267 10*3/uL (ref 150–400)
RBC: 3.69 MIL/uL — ABNORMAL LOW (ref 3.87–5.11)
RDW: 15.4 % (ref 11.5–15.5)
WBC: 11.4 10*3/uL — ABNORMAL HIGH (ref 4.0–10.5)

## 2017-01-11 LAB — RENAL FUNCTION PANEL
Albumin: 2.2 g/dL — ABNORMAL LOW (ref 3.5–5.0)
BUN: 50 mg/dL — AB (ref 6–20)
CO2: 15 mmol/L — ABNORMAL LOW (ref 22–32)
CREATININE: 1.18 mg/dL — AB (ref 0.44–1.00)
Calcium: 8.5 mg/dL — ABNORMAL LOW (ref 8.9–10.3)
Chloride: >130 mmol/L (ref 101–111)
GFR calc non Af Amer: 47 mL/min — ABNORMAL LOW (ref >60)
GFR, EST AFRICAN AMERICAN: 54 mL/min — AB (ref >60)
Glucose, Bld: 176 mg/dL — ABNORMAL HIGH (ref 65–99)
POTASSIUM: 3.3 mmol/L — AB (ref 3.5–5.1)
Phosphorus: 4.4 mg/dL (ref 2.5–4.6)
Sodium: 161 mmol/L (ref 135–145)

## 2017-01-11 LAB — C DIFFICILE QUICK SCREEN W PCR REFLEX
C DIFFICILE (CDIFF) INTERP: NOT DETECTED
C DIFFICILE (CDIFF) TOXIN: NEGATIVE
C Diff antigen: NEGATIVE

## 2017-01-11 LAB — CULTURE, RESPIRATORY

## 2017-01-11 LAB — GLUCOSE, CAPILLARY
GLUCOSE-CAPILLARY: 139 mg/dL — AB (ref 65–99)
GLUCOSE-CAPILLARY: 150 mg/dL — AB (ref 65–99)
GLUCOSE-CAPILLARY: 155 mg/dL — AB (ref 65–99)
Glucose-Capillary: 149 mg/dL — ABNORMAL HIGH (ref 65–99)
Glucose-Capillary: 181 mg/dL — ABNORMAL HIGH (ref 65–99)
Glucose-Capillary: 116 mg/dL — ABNORMAL HIGH (ref 65–99)

## 2017-01-11 LAB — CULTURE, RESPIRATORY W GRAM STAIN: Culture: NORMAL

## 2017-01-11 LAB — MAGNESIUM: Magnesium: 2.3 mg/dL (ref 1.7–2.4)

## 2017-01-11 MED ORDER — AMLODIPINE BESYLATE 10 MG PO TABS
10.0000 mg | ORAL_TABLET | Freq: Every day | ORAL | Status: DC
  Administered 2017-01-11 – 2017-01-12 (×2): 10 mg
  Filled 2017-01-11 (×2): qty 1

## 2017-01-11 MED ORDER — POTASSIUM CHLORIDE 20 MEQ/15ML (10%) PO SOLN
40.0000 meq | Freq: Once | ORAL | Status: AC
  Administered 2017-01-11: 40 meq
  Filled 2017-01-11: qty 30

## 2017-01-11 NOTE — Progress Notes (Signed)
Daily Progress Note   Patient Name: Sherri Hale       Date: 01/11/2017 DOB: 05/15/1951  Age: 66 y.o. MRN#: [id number] Attending Physician: Corrie Dandy, [place] Primary Care Physician: No primary care provider on file. Admit Date: 01/19/2017  Reason for Consultation/Follow-up: Establishing goals of care  Subjective: Examined patient briefly at bedside.  She appears much more comfortable on fentanyl gtt in addition to precedex.  Bedside RN tells me she turned them down this morning and patient had increased work of breathing once again - so it seems both are needed.  I spoke with Sherri Hale at bedside.  She described more family conflict with both her brother and her uncle.  There are disagreements about who will take the body for cremation, memorial services, her mother's property, etc. Sherri Hale states that multiple family members blame her for her mother's [place] and they are being very uncooperative with her & shunning her.  Fortunately Sherri Hale has a supportive husband.  She requests that her mother be extubated on Sunday.  It seems the rest of the family is coming in Saturday evening.  We discussed this with Dr. Ashok Cordia.    Dr. Ashok Cordia offered to contact a Catholic priest to give Last Rites.  Sherri Hale hesitated and then decided that she will care for calling the [facility] and asking them to meet with the family on Sat/Sun at the appropriate time.    Having some control seemed to comfort Sherri Hale.   Assessment: 68 yof with extensive SAH.  Non survivable.  Plan for extubation on Sunday after family members arrive from [place] and [place] on Sat evening.  Strained family dynamics.  There seems to be a struggle for control between the patient's daughter and brother.   Length of Stay: 9  Current  Medications: Scheduled Meds:  . amLODipine  10 mg Per Tube Daily  . chlorhexidine gluconate (MEDLINE KIT)  15 mL Mouth Rinse BID  . Chlorhexidine Gluconate Cloth  6 each Topical Daily  . free water  300 mL Per Tube Q4H  . insulin aspart  0-9 Units Subcutaneous Q4H  . insulin glargine  10 Units Subcutaneous Daily  . losartan  100 mg Per Tube Daily  . mouth rinse  15 mL Mouth Rinse 10 times per day  . multivitamin  15 mL Per  Tube Daily  . NiMODipine  60 mg Per Tube Q4H  . pantoprazole (PROTONIX) IV  40 mg Intravenous QHS  . sodium chloride flush  10-40 mL Intracatheter Q12H    Continuous Infusions: . sodium chloride Stopped (01/10/17 1736)  . ciprofloxacin Stopped (01/11/17 1127)  . dexmedetomidine 0.402 mcg/kg/hr (01/11/17 1400)  . dextrose 5 % and 0.45% NaCl 75 mL/hr at 01/11/17 1400  . fentaNYL infusion INTRAVENOUS 40 mcg/hr (01/11/17 1400)  . levETIRAcetam Stopped (01/11/17 4259)  . niCARDipine 7.5 mg/hr (01/11/17 1433)    PRN Meds: acetaminophen (TYLENOL) oral liquid 160 mg/5 mL, fentaNYL, fentaNYL (SUBLIMAZE) injection, labetalol, ondansetron (ZOFRAN) IV, sodium chloride flush  Physical Exam        Well developed female.  Non responsive to voice or touch, intubated sedated. Appears comfortable.  Vital Signs: BP 122/60   Pulse 60   Temp 97 F (36.1 C) (Axillary)   Resp (!) 23   Ht 5' 3"  (1.6 m)   Wt 93.5 kg (206 lb 2.1 oz)   LMP  (LMP Unknown)   SpO2 97%   BMI 36.51 kg/m  SpO2: SpO2: 97 % O2 Device: O2 Device: Ventilator O2 Flow Rate: O2 Flow Rate (L/min): 15 L/min  Intake/output summary:  Intake/Output Summary (Last 24 hours) at 01/11/17 1447 Last data filed at 01/11/17 1400  Gross per 24 hour  Intake          5847.98 ml  Output             2570 ml  Net          3277.98 ml   LBM: Last BM Date: 01/11/17 Baseline Weight: Weight: 81.6 kg (180 lb) Most recent weight: Weight: 93.5 kg (206 lb 2.1 oz)       Palliative Assessment/Data:    Flowsheet Rows       Most Recent Value  Intake Tab  Referral Department  Critical care  Unit at Time of Referral  ICU  Palliative Care Primary Diagnosis  Neurology  Date Notified  01/08/17  Palliative Care Type  New Palliative care  Reason for referral  Clarify Goals of Care  Date of Admission  01/12/2017  Date first seen by Palliative Care  01/08/17  # of days IP prior to Palliative referral  6  Clinical Assessment  Palliative Performance Scale Score  10%  Dyspnea Max Last 24 Hours  6  Psychosocial & Spiritual Assessment  Palliative Care Outcomes      Patient Active Problem List   Diagnosis Date Noted  . Palliative care by specialist   . DNR (do not resuscitate)   . 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 SAH (subarachnoid hemorrhage) ([place]) 01/26/2017  . Subarachnoid hemorrhage due to ruptured aneurysm ([place]) 01/11/2017  . Acute respiratory failure [facility])     Palliative Care Plan    Recommendations/Plan:    Plan for extubation without re-intubation on Sunday.  It is not anticipated that [name] will survive    Will request that another member of the PMT follow up on Sunday to work in concert with CCM.  Goals of Care and Additional Recommendations:  Limitations on Scope of Treatment: Full Comfort Care  Code Status:  DNR  Prognosis:   Hours - Days   Discharge Planning:  Anticipated Hospital Death  Care plan was discussed with Dr. Ashok Cordia, bedside RN, Patient's dtr Sherri Hale.  Thank you for allowing the Palliative Medicine Team to assist in the care of this patient.  Total time spent:  35 min.     Greater than 50%  of this time was spent counseling and coordinating care related to the above assessment and plan.  Imogene Burn, PA-C Palliative Medicine  Please contact Palliative MedicineTeam phone at [phone] for questions and concerns between 7 am - 7 pm.   Please see AMION for individual provider pager numbers.

## 2017-01-11 NOTE — Progress Notes (Signed)
No issues overnight.   EXAM:  BP 119/65   Pulse (!) 59   Temp (!) 96.6 F (35.9 C) (Axillary) Comment: warm blanket applied  Resp (!) 22   Ht 5\' 3"  (1.6 m)   Wt 93.5 kg (206 lb 2.1 oz)   LMP  (LMP Unknown)   SpO2 97%   BMI 36.51 kg/m   No eye opening Pupils pinpoint Breathes over vent  (+) corneals Minimal extension LUE to noxious stimuli  IMPRESSION:  66 y.o. female high-grade SAH d# 7 s/p Acom coiling. Has not demonstrated any sign of meaningful improvement. Prognosis remains grim.   PLAN: - Cont supportive care - Pt changed to DNR by family, planning on terminal wean this weekend when family arrives.

## 2017-01-11 NOTE — Progress Notes (Signed)
[place] Attending Note  ADMISSION DATE:  12/30/2016  CONSULTATION DATE:  01/02/2017  REFERRING MD:  Dr. Oleta Mouse EDP  CHIEF COMPLAINT:  SAH  Presenting HPI:  66 y.o.  female with past medical history of hypertension who presented to [facility] emergency department 6/6 via EMS after collapsing at [facility] While at the [facility] she was complaining of severe headache and then suddenly became unresponsive. She developed altered mental status and vomited.  Intubated for airway protection.  Found to be profoundly hypertensive with systolic blood pressures in the 230s. In the emergency department she was started on nicardipine infusion and loaded with IV Keppra. She was taken for a CT scan of the head which showed extensive subarachnoid hemorrhage. She was evaluated by Neurosurgery.   Ultimately went to Neuro IR for coiling.   Subjective:  Patient found to have an SBO versus ileus overnight.   Review of Systems:  Unable to obtain given intubation & altered mentation.   Vent Mode: PRVC FiO2 (%):  [30 %] 30 % Set Rate:  [20 bmp] 20 bmp Vt Set:  [490 mL] 490 mL PEEP:  [5 cmH20] 5 cmH20 Plateau Pressure:  [29 cmH20] 29 cmH20  Temp:  [97.1 F (36.2 C)-99 F (37.2 C)] 97.1 F (36.2 C) (06/15 0800) Pulse Rate:  [59-86] 59 (06/15 1100) Resp:  [24-36] 27 (06/15 1100) BP: (107-150)/(47-85) 121/60 (06/15 1100) SpO2:  [93 %-98 %] 97 % (06/15 1100) Arterial Line BP: (127-158)/(53-74) 132/57 (06/15 1000) FiO2 (%):  [30 %] 30 % (06/15 0800) Weight:  [206 lb 2.1 oz (93.5 kg)] 206 lb 2.1 oz (93.5 kg) (06/15 0436)  General:  Eyes closed. No distress.  Integument:  Warm & dry. No rash on exposed skin.  Extremities:  No cyanosis or clubbing.  HEENT:   No scleral injection or icterus. Endotracheal tube in place.  Cardiovascular:  Regular rate. Anasarca. Unable to appreciate JVD.  Pulmonary:  Coarse breath sounds bilaterally. Symmetric chest wall rise on ventilator Abdomen:  Soft. Normal bowel sounds. Protuberant. Musculoskeletal:  Normal bulk and tone. No joint deformity or effusion appreciated. Neurological: Pupils asymmetric with forward gaze. No spontaneous movements. Upgoing toes bilaterally. Cough present. No withdrawal to pain.  LINES/TUBES: OETT 6/6 >>> RUE PICC 6/9 >>> Foley 6/6 >>> OGT 6/6 >>> L Rad Art Line 6/6 >>> PIV  CBC Latest Ref Rng & Units 01/11/2017 01/10/2017 01/09/2017  WBC 4.0 - 10.5 K/uL 11.4(H) 8.7 8.5  Hemoglobin 12.0 - 15.0 g/dL 10.4(L) 10.0(L) 10.1(L)  Hematocrit 36.0 - 46.0 % 32.8(L) 31.5(L) 31.0(L)  Platelets 150 - 400 K/uL 267 242 236    BMP Latest Ref Rng & Units 01/11/2017 01/10/2017 01/09/2017  Glucose 65 - 99 mg/dL 176(H) 194(H) 181(H)  BUN 6 - 20 mg/dL 50(H) 57(H) 52(H)  Creatinine 0.44 - 1.00 mg/dL 1.18(H) 1.28(H) 1.24(H)  Sodium 135 - 145 mmol/L 161(HH) 161(HH) 163(HH)  Potassium 3.5 - 5.1 mmol/L 3.3(L) 2.6(LL) 2.8(L)  Chloride 101 - 111 mmol/L >130(HH) >130(HH) >130(HH)  CO2 22 - 32 mmol/L 15(L) 16(L) 16(L)  Calcium 8.9 - 10.3 mg/dL 8.5(L) 8.4(L) 8.5(L)   Hepatic Function Latest Ref Rng & Units 01/11/2017 01/10/2017 01/09/2017  Total Protein 6.5 - 8.1 g/dL - - -  Albumin 3.5 - 5.0 g/dL 2.2(L) 2.1(L) 2.0(L)  AST 15 - 41 U/L - - -  ALT 14 - 54 U/L - - -  Alk Phosphatase 38 - 126 U/L - - -  Total Bilirubin 0.3 - 1.2 mg/dL - - -  IMAGING/STUDIES: UDS 6/6:  Negative  CT HEAD W/O 6/6: IMPRESSION: 1. Massive subarachnoid hemorrhage over both convexities and within the sylvian fissures, basal cisterns and the lateral and third ventricles. Given the distribution, an aneurysmal etiology is favored, though other entities may also cause this distribution. However, it is somewhat unclear whether there may be a component of intraparenchymal hemorrhage of the inferomedial frontal lobe. CTA of the head is recommended. 2. Effacement of the basal cisterns and crowding of the foramen magnum with early/impending herniation of the  cerebellar tonsils. PORT CXR 6/12:  Previously reviewed by me. Endotracheal tube in good position. Enteric feeding tube coursing below diaphragm. Persistent lower lobe and perihilar patchy opacification with some slight worsening of silhouetting of bilateral hemidiaphragms. PORT ABD X-RAY 2 VIEW 6/14:  Findings compatible with a small bowel ileus or mid to distal small bowel obstruction. The nasogastric tube is in reasonable position but could be advanced an additional 5-10 cm to assure that the proximal port remains below the GE junction.  MICROBIOLOGY: MRSA PCR 6/6:  Negative  Tracheal Aspirate Culture 6/9: MSSA & Klebsiella (sensitive to Unasyn) Blood Cultures x2 6/12 >>> Tracheal Aspirate Culture 6/12 >>> Rare yeast Urine Culture 6/12:  Negative  Stool C diff 6/15 >>>  ANTIBIOTICS: Unasyn 6/10 - 6/13 Cipro 6/13 >>>  SIGNIFICANT EVENTS: 06/06 - Admit 06/07 - ACOM aneurysm coiling 06/09 - Antibiotics empirically started for increased secretions 06/12 - Febrile >> recultured 06/13 - Unasyn switched to Cipro given sensitivities & worsening hypernatremia. FWD 5.4 L>>started Free Water 200cc VT q4hr  ASSESSMENT/PLAN:  65 y.o.  female with acute encephalopathy secondary to subarachnoid hemorrhage. No further signs of neurologic recovery at this time. Status post aneurysm coiling.  1. Acute encephalopathy: Multifactorial. Unable to wean Precedex. Low-dose fentanyl started for patient comfort. 2. Subarachnoid hemorrhage: Management per neurology. Status post aneurysm coiling. Continuing nimodipine and Keppra. 3. Ileus versus SBO: Patient with diarrhea. Checking stool C. difficile. Repeat abdominal x-ray. 4. Acute hypoxic respiratory failure: Multifactorial. Secondary to healthcare associated pneumonia. Continuing full ventilator support with plans for terminal wean and extubation on Sunday. 5. Klebsiella & MSSA healthcare associated pneumonia: Continuing Cipro day #6/14 of  antibiotics. 6. Hypernatremia/hyperchloremia: Stable. Continuing free water 300 mL via tube every 4 hours. Trending electrolytes daily. 7. Hypertensive emergency: Continuing losartan and Norvasc. Weaning Cardene drip. Discontinuing arterial line. Monitoring vitals per unit protocol. 8. Fever of unknown origin: Improving. Awaiting finalization of 6/12 cultures. 9. Anemia: No signs of active bleeding. Trending cell counts daily with CBC. 10. Hyperglycemia: Into doing Lantus & sliding scale insulin with Accu-Cheks every 4 hours. 11. Metabolic acidosis: Stable. Secondary to hyperchloremia.   Prophylaxis:  SCDs & Protonix IV daily at bedtime. Diet:  NPO. Holding tube feedings.  Code Status: DNR. Disposition:  Family plan to transition to full comfort care with one way extubation on Sunday. Family Update:  Daughter and brother updated during rounds today.  I have spent a total of 34 minutes of critical care time today caring for the patient, discussing plan of care with family, and reviewing the patient's electronic medical record.   Jennings E. Nestor, M.D. [facility] Pager:  [phone] After 3pm or if no response, call [phone] 11:36 AM 01/11/17       

## 2017-01-11 NOTE — Progress Notes (Signed)
eLink Physician-Brief Progress Note Patient Name: Sherri Hale DOB: [birth year] MRN: [id number]   Date of Service  01/11/2017  HPI/Events of Note  Hypokalemia  eICU Interventions  Potassium replaced     Intervention Category Intermediate Interventions: Electrolyte abnormality - evaluation and management  DETERDING,ELIZABETH 01/11/2017, 6:35 AM

## 2017-01-11 NOTE — Progress Notes (Signed)
Transcranial Doppler  Date POD PCO2 HCT BP  MCA ACA PCA OPHT SIPH VERT Basilar  6/8 JE   33.6 145/53 Right  Left   56  *   *  *   *  *   24  25   *  *   -21  *   *      6/11 vs     Right  Left   44  *   *  *   53  *   25  25   67  *   47  -22   -46      6/13 JE   31 136/55 Right  Left   95  *   -77  *   45  *   34  17   *  *   -29  *   -42       6/15 JE   32.8 121/60 Right  Left   100  *   *  *   *  *   10  13   66  40   -33  *   -41            Right  Left                                            Right  Left                                            Right  Left                                        MCA = Middle Cerebral Artery      OPHT = Opthalmic Artery     BASILAR = Basilar Artery   ACA = Anterior Cerebral Artery     SIPH = Carotid Siphon PCA = Posterior Cerebral Artery   VERT = Verterbral Artery                   Normal MCA = 62+\-12 ACA = 50+\-12 PCA = 42+\-23   6/8 Rt Lindegaard ratio 2.1. Very poor study due to poor windows and patient forcefully keeping head turned to left. JE 6/11 Poor study for reasons given on 6/8 Lindegaard ratio 1.16  6/13 Technically difficult due to poor windows, poor patient position, and movement from ventilator. Lindegaard ratio Right 2.2. JE  6/15 Technically limited due to previous reasons. Lindegaard ratio Right 3.0  JE

## 2017-01-11 NOTE — Progress Notes (Signed)
STROKE TEAM PROGRESS NOTE   SUBJECTIVE (INTERVAL HISTORY) Extensive SAH due to aneurysm. Exam unchanged today, no significant improvement. Palliative care is involved. Spoke to brother at bedside and sister in [place] over the phone yesterday. They understand prognosis and will extubate Sunday.  OBJECTIVE Temp:  [97.8 F (36.6 C)-99 F (37.2 C)] 97.8 F (36.6 C) (06/15 0400) Pulse Rate:  [59-86] 60 (06/15 0700) Cardiac Rhythm: Normal sinus rhythm (06/15 0000) Resp:  [24-36] 27 (06/15 0700) BP: (106-150)/(47-81) 130/61 (06/15 0700) SpO2:  [93 %-98 %] 95 % (06/15 0700) Arterial Line BP: (117-158)/(53-74) 154/64 (06/15 0700) FiO2 (%):  [30 %] 30 % (06/15 0428) Weight:  [206 lb 2.1 oz (93.5 kg)] 206 lb 2.1 oz (93.5 kg) (06/15 0436)  CBC:   Recent Labs Lab 01/10/17 0500 01/11/17 0428  WBC 8.7 11.4*  NEUTROABS 6.3 8.3*  HGB 10.0* 10.4*  HCT 31.5* 32.8*  MCV 89.0 88.9  PLT 242 267    Basic Metabolic Panel:   Recent Labs Lab 01/10/17 0500 01/11/17 0428  NA 161* 161*  K 2.6* 3.3*  CL >130* >130*  CO2 16* 15*  GLUCOSE 194* 176*  BUN 57* 50*  CREATININE 1.28* 1.18*  CALCIUM 8.4* 8.5*  MG 2.3 2.3  PHOS 4.5 4.4    Lipid Panel:     Component Value Date/Time   TRIG 414 (H) 01/05/2017 1950   HgbA1c: No results found for: HGBA1C Urine Drug Screen:     Component Value Date/Time   LABOPIA NONE DETECTED 01/16/2017 1821   COCAINSCRNUR NONE DETECTED 01/03/2017 1821   LABBENZ NONE DETECTED 01/09/2017 1821   AMPHETMU NONE DETECTED 12/29/2016 1821   THCU NONE DETECTED 01/09/2017 1821   LABBARB NONE DETECTED 01/14/2017 1821    Alcohol Level     Component Value Date/Time   ETH <5 01/15/2017 1810    IMAGING I have personally reviewed the radiological images below and agree with the radiology interpretations.  Ct Head Wo Contrast 12/31/2016 1. Massive subarachnoid hemorrhage over both convexities and within the sylvian fissures, basal cisterns and the lateral and third  ventricles. Given the distribution, an aneurysmal etiology is favored, though other entities may also cause this distribution. However, it is somewhat unclear whether there may be a component of intraparenchymal hemorrhage of the inferomedial frontal lobe. CTA of the head is recommended. 2. Effacement of the basal cisterns and crowding of the foramen magnum with early/impending herniation of the cerebellar tonsils.   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 No aneurysm filling is seen post-embolization.   TTE - Left ventricle: The cavity size was normal. There was moderate   focal basal hypertrophy. Systolic function was vigorous. The   estimated ejection fraction was in the range of 65% to 70%. Wall   motion was normal; there were no regional wall motion   abnormalities. Left ventricular diastolic function parameters   were normal. - Aortic valve: Poorly visualized. Valve area (VTI): 1.91 cm^2.   Valve area (Vmax): 1.83 cm^2. Valve area (Vmean): 1.9 cm^2.  EEG - This is an abnormal EEG due to moderate diffuse generalized slowing with poor reactivity and disorganization of the background. No seizures. There is a greater degree of slowing in the right frontal region suggesting more localized cerebral dysfunction in that area. Clinical correlation: This study is consistent with moderate global cerebral dysfunction with possibly greater dysfunction in the right frontal region. No evidence of seizures. Clinical  correlation is recommended.  TCD no vasospasm but limited study   PHYSICAL EXAM  Temp:  [97.8 F (36.6 C)-99 F (37.2 C)] 97.8 F (36.6 C) (06/15 0400) Pulse Rate:  [59-86] 60 (06/15 0700) Resp:  [24-36] 27 (06/15 0700) BP: (106-150)/(47-81) 130/61 (06/15 0700) SpO2:  [93 %-98 %] 95 % (06/15 0700) Arterial Line BP: (117-158)/(53-74) 154/64 (06/15 0700) FiO2 (%):   [30 %] 30 % (06/15 0428) Weight:  [206 lb 2.1 oz (93.5 kg)] 206 lb 2.1 oz (93.5 kg) (06/15 0436)  General - Well nourished, well developed   Ophthalmologic - Fundi not visualized due to small pupils.  Cardiovascular - Regular rate and rhythm. 2 + edema extremities  Neuro - intubated on minimal sedation, not open eyes or following commands. Eyes middle position, conjugate, but no doll's eyes on head moving. Pupils equal round but do not appear reactive. . Weak right corneals, no left corneal,  but positive gag. Breathing over the vent. On pain stimulation,  No response uppers, extensor posturing on lowers. . Increased tone bilaterally, with bilateral babinski. Sensation, coordination and gait not tested.   ASSESSMENT/PLAN [name] is a 66 y.o. female with history of HTN presenting with severe HA which progressed to unresponsive state, seizure activity and vomiting. CT showed a extensive SAH.   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 Effacement of the basal cisterns with early/impending cerebellar herniation  Cerebral angio successfully coiled ruptured ACOM aneurysm  NSG on board  TTE EF 65-70%  EEG diffuse slowing and no seizure  SCDs for VTE prophylaxis Diet NPO time specified  No antithrombotic prior to admission  Nimodipine started 01/21/2017  Ongoing aggressive stroke risk factor management  Therapy recommendations:  pending   Disposition:  Pending palliative care meeting  Res[facility] failure  [facility]  Intubated in ED  managemed by CCM  Hypertensive Emergency  SBP 300 palp on scene  On cardene for BP control  SBP goal < 180   Currently BP at goal.    ? Seizure - more likely stimulation induced decorticate posturing with unsustained LUE myoclonus, resolved on  today's exam  High risk for seizure  on Keppra 1000mg  bid  EEG diffuse slowing but no seizure  Seizure precuation  Other Stroke Risk Factors  Advanced age  Other Active Problems  hypocalcemia  Hypokalemia  Hospital day # 9  This patient is critically ill and at significant risk of neurological worsening, death and care requires constant monitoring of vital signs, hemodynamics,respiratory and cardiac monitoring,review of multiple databases, neurological assessment, discussion with family, other specialists and medical decision making of high complexity.I  I spent 30 minutes of neurocritical care time in the care of this patient. Patient is to be terminally extubated on Sunday.  Naomie DeanAntonia Venora Kautzman, MD [facility]  To contact Stroke Continuity provider, please refer to [email or web address]. After hours, contact General Neurology

## 2017-01-12 LAB — RENAL FUNCTION PANEL
Albumin: 2 g/dL — ABNORMAL LOW (ref 3.5–5.0)
BUN: 34 mg/dL — ABNORMAL HIGH (ref 6–20)
CALCIUM: 8.3 mg/dL — AB (ref 8.9–10.3)
CO2: 15 mmol/L — AB (ref 22–32)
Chloride: >130 mmol/L (ref 101–111)
Creatinine, Ser: 1.03 mg/dL — ABNORMAL HIGH (ref 0.44–1.00)
GFR calc Af Amer: >60 mL/min (ref >60)
GFR calc non Af Amer: 55 mL/min — ABNORMAL LOW (ref >60)
GLUCOSE: 125 mg/dL — AB (ref 65–99)
Phosphorus: 3.6 mg/dL (ref 2.5–4.6)
Potassium: 2.9 mmol/L — ABNORMAL LOW (ref 3.5–5.1)
SODIUM: 155 mmol/L — AB (ref 135–145)

## 2017-01-12 LAB — CBC WITH DIFFERENTIAL/PLATELET
BASOS PCT: 0 %
Basophils Absolute: 0 10*3/uL (ref 0.0–0.1)
EOS ABS: 0.2 10*3/uL (ref 0.0–0.7)
Eosinophils Relative: 2 %
HCT: 33.1 % — ABNORMAL LOW (ref 36.0–46.0)
HEMOGLOBIN: 10.4 g/dL — AB (ref 12.0–15.0)
LYMPHS PCT: 15 %
Lymphs Abs: 1.2 10*3/uL (ref 0.7–4.0)
MCH: 28 pg (ref 26.0–34.0)
MCHC: 31.4 g/dL (ref 30.0–36.0)
MCV: 89.2 fL (ref 78.0–100.0)
Monocytes Absolute: 0.4 10*3/uL (ref 0.1–1.0)
Monocytes Relative: 5 %
NEUTROS ABS: 6.1 10*3/uL (ref 1.7–7.7)
Neutrophils Relative %: 78 %
Platelets: 243 10*3/uL (ref 150–400)
RBC: 3.71 MIL/uL — ABNORMAL LOW (ref 3.87–5.11)
RDW: 15.3 % (ref 11.5–15.5)
WBC: 7.9 10*3/uL (ref 4.0–10.5)

## 2017-01-12 LAB — GLUCOSE, CAPILLARY
GLUCOSE-CAPILLARY: 126 mg/dL — AB (ref 65–99)
GLUCOSE-CAPILLARY: 137 mg/dL — AB (ref 65–99)
Glucose-Capillary: 109 mg/dL — ABNORMAL HIGH (ref 65–99)
Glucose-Capillary: 141 mg/dL — ABNORMAL HIGH (ref 65–99)
Glucose-Capillary: 109 mg/dL — ABNORMAL HIGH (ref 65–99)
Glucose-Capillary: 116 mg/dL — ABNORMAL HIGH (ref 65–99)

## 2017-01-12 LAB — MAGNESIUM: Magnesium: 2.2 mg/dL (ref 1.7–2.4)

## 2017-01-12 MED ORDER — POTASSIUM CHLORIDE 20 MEQ/15ML (10%) PO SOLN
40.0000 meq | ORAL | Status: AC
  Administered 2017-01-12 (×2): 40 meq
  Filled 2017-01-12 (×2): qty 30

## 2017-01-12 NOTE — Progress Notes (Signed)
eLink Physician-Brief Progress Note Patient Name: Sherri Hale Chimeslicia Correa Basto DOB: [birth year] MRN: [id number]   Date of Service  01/12/2017  HPI/Events of Note  Hypokalemia  eICU Interventions  Potassium replaced     Intervention Category Intermediate Interventions: Electrolyte abnormality - evaluation and management  DETERDING,ELIZABETH 01/12/2017, 5:55 AM

## 2017-01-12 NOTE — Progress Notes (Signed)
Patient ID: Sherri Hale, female   DOB: 18-Sep-1950, 66 y.o.   MRN: [id number] BP 132/66   Pulse (!) 59   Temp 97.6 F (36.4 C) (Axillary)   Resp (!) 0   Ht 5\' 3"  (1.6 m)   Wt 92.7 kg (204 lb 5.9 oz)   LMP  (LMP Unknown)   SpO2 100%   BMI 36.20 kg/m  Comatose, minimal reaction to noxious stimuli Very weak cough perrl Minimal movement in the lower extremities No change.

## 2017-01-12 NOTE — Progress Notes (Signed)
STROKE TEAM PROGRESS NOTE   SUBJECTIVE (INTERVAL HISTORY) Extensive SAH due to aneurysm. Exam unchanged today, no significant improvement. Palliative care is involved. Spoke to brother at bedside and sister in [place] over the phone yesterday. They understand prognosis and will extubate Sunday.  No change in the patient's condition. She will be extubated on tomorrow. Again, overall prognosis is poor given the extensive subretinal hemorrhage and a poor examination.   OBJECTIVE Temp:  [96.6 F (35.9 C)-97.5 F (36.4 C)] 97.5 F (36.4 C) (06/16 0400) Pulse Rate:  [59-74] 60 (06/16 0700) Cardiac Rhythm: Atrial paced;Normal sinus rhythm (06/15 2000) Resp:  [6-30] 14 (06/16 0700) BP: (105-174)/(52-85) 120/71 (06/16 0700) SpO2:  [96 %-99 %] 98 % (06/16 0700) Arterial Line BP: (132-161)/(56-76) 161/65 (06/15 1200) FiO2 (%):  [30 %] 30 % (06/16 0339) Weight:  [92.7 kg (204 lb 5.9 oz)] 92.7 kg (204 lb 5.9 oz) (06/16 0200)  CBC:   Recent Labs Lab 01/11/17 0428 01/12/17 0500  WBC 11.4* 7.9  NEUTROABS 8.3* 6.1  HGB 10.4* 10.4*  HCT 32.8* 33.1*  MCV 88.9 89.2  PLT 267 243    Basic Metabolic Panel:   Recent Labs Lab 01/11/17 0428 01/12/17 0500  NA 161* 155*  K 3.3* 2.9*  CL >130* >130*  CO2 15* 15*  GLUCOSE 176* 125*  BUN 50* 34*  CREATININE 1.18* 1.03*  CALCIUM 8.5* 8.3*  MG 2.3 2.2  PHOS 4.4 3.6    Lipid Panel:     Component Value Date/Time   TRIG 414 (H) 01/05/2017 1950   HgbA1c: No results found for: HGBA1C Urine Drug Screen:     Component Value Date/Time   LABOPIA NONE DETECTED 12/17/2016 1821   COCAINSCRNUR NONE DETECTED 12/17/2016 1821   LABBENZ NONE DETECTED 12/17/2016 1821   AMPHETMU NONE DETECTED 12/17/2016 1821   THCU NONE DETECTED 12/17/2016 1821   LABBARB NONE DETECTED 12/17/2016 1821    Alcohol Level     Component Value Date/Time   ETH <5 12/17/2016 1810    IMAGING  Ct Head Wo Contrast 01/12/2017 1. Massive subarachnoid hemorrhage over  both convexities and within the sylvian fissures, basal cisterns and the lateral and third ventricles. Given the distribution, an aneurysmal etiology is favored, though other entities may also cause this distribution. However, it is somewhat unclear whether there may be a component of intraparenchymal hemorrhage of the inferomedial frontal lobe. CTA of the head is recommended.  2. Effacement of the basal cisterns and crowding of the foramen magnum with early/impending herniation of the cerebellar tonsils.   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 No aneurysm filling is seen post-embolization.   TTE - Left ventricle: The cavity size was normal. There was moderate   focal basal hypertrophy. Systolic function was vigorous. The   estimated ejection fraction was in the range of 65% to 70%. Wall   motion was normal; there were no regional wall motion   abnormalities. Left ventricular diastolic function parameters   were normal. - Aortic valve: Poorly visualized. Valve area (VTI): 1.91 cm^2.   Valve area (Vmax): 1.83 cm^2. Valve area (Vmean): 1.9 cm^2.  EEG  01/21/2017 This is an abnormal EEG due to moderate diffuse generalized slowing with poor reactivity and disorganization of the background. No seizures. There is a greater degree of slowing in the right frontal region suggesting more localized cerebral dysfunction in that area. Clinical correlation: This study is consistent with  moderate global cerebral dysfunction with possibly greater dysfunction in the right frontal region. No evidence of seizures. Clinical correlation is recommended.   TCD no vasospasm but limited study     PHYSICAL EXAM  Patient is intubated. There is no eye opening to deep painful stimuli. Pupils are mid  mid position approximately 5 mm and the rectus reactive. Oculocephalic reflexes and  corneal are markedly diminished. The patient does not follow commands. There is no movements of the extremities to deep painful stem light.  Temp:  [96.6 F (35.9 C)-97.5 F (36.4 C)] 97.5 F (36.4 C) (06/16 0400) Pulse Rate:  [59-74] 60 (06/16 0700) Resp:  [6-30] 14 (06/16 0700) BP: (105-174)/(52-85) 120/71 (06/16 0700) SpO2:  [96 %-99 %] 98 % (06/16 0700) Arterial Line BP: (132-161)/(56-76) 161/65 (06/15 1200) FiO2 (%):  [30 %] 30 % (06/16 0339) Weight:  [92.7 kg (204 lb 5.9 oz)] 92.7 kg (204 lb 5.9 oz) (06/16 0200)  General - Well nourished, well developed   Ophthalmologic - Fundi not visualized due to small pupils.  Cardiovascular - Regular rate and rhythm. 2 + edema extremities  Neuro - intubated on minimal sedation, not open eyes or following commands. Eyes middle position, conjugate, but no doll's eyes on head moving. Pupils equal round but do not appear reactive. . Weak right corneals, no left corneal,  but positive gag. Breathing over the vent. On pain stimulation,  No response uppers, extensor posturing on lowers. . Increased tone bilaterally, with bilateral babinski. Sensation, coordination and gait not tested.   ASSESSMENT/PLAN Ms. Dashae Wilcher is a 66 y.o. female with history of HTN presenting with severe HA which progressed to unresponsive state, seizure activity and vomiting. CT showed a extensive SAH.   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 Effacement of the basal cisterns with early/impending cerebellar herniation  Cerebral angio successfully coiled ruptured ACOM aneurysm  NSG on board  TTE EF 65-70%  EEG diffuse slowing and no seizure  SCDs for VTE prophylaxis Diet NPO time specified  No antithrombotic prior to admission  Nimodipine started 01/02/2017  Ongoing aggressive  stroke risk factor management  Therapy recommendations:  pending   Disposition:  Pending palliative care meeting  Respiratory failure  Secondary to [facility]  Intubated in ED  managemed by CCM  Hypertensive Emergency  SBP 300 palp on scene  On cardene for BP control  SBP goal < 180   Currently BP at goal.    ? Seizure - more likely stimulation induced decorticate posturing with unsustained LUE myoclonus, resolved on today's exam  High risk for seizure  on Keppra 1000mg  bid  EEG diffuse slowing but no seizure  Seizure precuation  Other Stroke Risk Factors  Advanced age  Other Active Problems  hypocalcemia  Hypokalemia  PLAN  Terminal extubation tomorrow  The stroke team will sign off at this time. Please call if we can be of further service.  Hospital day # 10     To contact Stroke Continuity provider, please refer to [email or web address]. After hours, contact General Neurology

## 2017-01-12 NOTE — Progress Notes (Signed)
PULMONARY / CRITICAL CARE MEDICINE   Name: Sherri Hale MRN: [id number] DOB: 10-16-1950    ADMISSION DATE:  01/10/2017 CONSULTATION DATE:  01/24/2017  REFERRING MD:  Verdie MosherLiu  CHIEF COMPLAINT:  Sub arachnoid hemorrhage  BRIEF:   66 y/o female admitted on 6/6 with a subarachnoid hemorrhage who has made no neurologic progress since admission.   SUBJECTIVE:  No acute issues  VITAL SIGNS: BP 127/64   Pulse 62   Temp 97.2 F (36.2 C) (Axillary)   Resp 20   Ht 5\' 3"  (1.6 m)   Wt 204 lb 5.9 oz (92.7 kg)   LMP  (LMP Unknown)   SpO2 97%   BMI 36.20 kg/m   HEMODYNAMICS:    VENTILATOR SETTINGS: Vent Mode: PRVC FiO2 (%):  [30 %] 30 % Set Rate:  [20 bmp] 20 bmp Vt Set:  [490 mL] 490 mL PEEP:  [5 cmH20] 5 cmH20 Plateau Pressure:  [25 cmH20-29 cmH20] 25 cmH20  INTAKE / OUTPUT: I/O last 3 completed shifts: In: 7022.6 [I.V.:4822.6; NG/GT:1580; IV Piggyback:620] Out: 3815 [Urine:3175; Emesis/NG output:290; Stool:350]  PHYSICAL EXAMINATION: General:  On vent, in bed Neuro:  No response to extrenal stimuli for me HEENT:  NCAT facial swelling noted, ETT in place Cardiovascular:  RRR, no mgr Lungs:  Lungs clear, vent supported breathing Abdomen:  BS+, soft, nontender Musculoskeletal:  Normal bulk, no bony abnormalities Skin:  No rash or skin breakdown  LABS:  BMET  Recent Labs Lab 01/10/17 0500 01/11/17 0428 01/12/17 0500  NA 161* 161* 155*  K 2.6* 3.3* 2.9*  CL >130* >130* >130*  CO2 16* 15* 15*  BUN 57* 50* 34*  CREATININE 1.28* 1.18* 1.03*  GLUCOSE 194* 176* 125*    Electrolytes  Recent Labs Lab 01/10/17 0500 01/11/17 0428 01/12/17 0500  CALCIUM 8.4* 8.5* 8.3*  MG 2.3 2.3 2.2  PHOS 4.5 4.4 3.6    CBC  Recent Labs Lab 01/10/17 0500 01/11/17 0428 01/12/17 0500  WBC 8.7 11.4* 7.9  HGB 10.0* 10.4* 10.4*  HCT 31.5* 32.8* 33.1*  PLT 242 267 243    Coag's No results for input(s): APTT, INR in the last 168 hours.  Sepsis Markers No results  for input(s): LATICACIDVEN, PROCALCITON, O2SATVEN in the last 168 hours.  ABG  Recent Labs Lab 01/07/17 1050 01/08/17 0320  PHART 7.267* 7.334*  PCO2ART 25.1* 23.0*  PO2ART 100 106    Liver Enzymes  Recent Labs Lab 01/10/17 0500 01/11/17 0428 01/12/17 0500  ALBUMIN 2.1* 2.2* 2.0*    Cardiac Enzymes No results for input(s): TROPONINI, PROBNP in the last 168 hours.  Glucose  Recent Labs Lab 01/11/17 1608 01/11/17 2018 01/12/17 0001 01/12/17 0355 01/12/17 0822 01/12/17 1139  GLUCAP 139* 116* 150* 109* 126* 141*    Imaging Dg Abd Portable 1v  Result Date: 01/11/2017 CLINICAL DATA:  Ileus EXAM: PORTABLE ABDOMEN - 1 VIEW COMPARISON:  01/10/2017 FINDINGS: Nasogastric tube tip is at the gastric pylorus region. There is gaseous distension of small and large bowel consistent with ileus. The degree of distention, especially of small bowel, is mildly improved. Grossly clear lung bases. IMPRESSION: 1. Peripyloric nasogastric tube tip. 2. Ileus pattern with improvement since yesterday. Electronically Signed   By: Marnee SpringJonathon  Watts M.D.   On: 01/11/2017 13:27    IMAGING/STUDIES: UDS 6/6:  Negative  CT HEAD W/O 6/6: IMPRESSION: 1. Massive subarachnoid hemorrhage over both convexities and within the sylvian fissures, basal cisterns and the lateral and third ventricles. Given the distribution, an aneurysmal etiology  is favored, though other entities may also cause this distribution. However, it is somewhat unclear whether there may be a component of intraparenchymal hemorrhage of the inferomedial frontal lobe. CTA of the head is recommended. 2. Effacement of the basal cisterns and crowding of the foramen magnum with early/impending herniation of the cerebellar tonsils. PORT CXR 6/12:  Previously reviewed by me. Endotracheal tube in good position. Enteric feeding tube coursing below diaphragm. Persistent lower lobe and perihilar patchy opacification with some slight worsening of  silhouetting of bilateral hemidiaphragms. PORT ABD X-RAY 2 VIEW 6/14:  Findings compatible with a small bowel ileus or mid to distal small bowel obstruction. The nasogastric tube is in reasonable position but could be advanced an additional 5-10 cm to assure that the proximal port remains below the GE junction.  MICROBIOLOGY: MRSA PCR 6/6:  Negative  Tracheal Aspirate Culture 6/9: MSSA & Klebsiella (sensitive to Unasyn) Blood Cultures x2 6/12 >>> Tracheal Aspirate Culture 6/12 >>> Rare yeast Urine Culture 6/12:  Negative  Stool C diff 6/15 >>>  ANTIBIOTICS: Unasyn 6/10 - 6/13 Cipro 6/13 >>>  SIGNIFICANT EVENTS: 06/06 - Admit 06/07 - ACOM aneurysm coiling 06/09 - Antibiotics empirically started for increased secretions 06/12 - Febrile >> recultured 06/13 - Unasyn switched to Cipro given sensitivities & worsening hypernatremia. FWD 5.4 L>>started Free Water 200cc VT q4hr  LINES/TUBES: OETT 6/6 >>> RUE PICC 6/9 >>> Foley 6/6 >>> OGT 6/6 >>> L Rad Art Line 6/6 >>> PIV  DISCUSSION: 66 y/o female with a devastating subarachnoid hemorrhage.  ASSESSMENT / PLAN:  PULMONARY A: Acute respiratory failure due to inability to protect airway P:   Continue full vent support for now Plan extubation tomorrow  CARDIOVASCULAR A:  SAH P:  BP goals per neurology  RENAL A:   Iatrogenic hypernatremia P:   Monitor BMET and UOP Replace electrolytes as needed   GASTROINTESTINAL A:   Ileus P:   Continue OG tube routine care  HEMATOLOGIC A:   No acute issues P:  Monitor for bleeding  INFECTIOUS A:   No acute issues P:   Monitor for fever  ENDOCRINE A:   Hyperglycemia> well controlled P:   Continue glargine Continue SSI  NEUROLOGIC A:   Subarachnoid hemorrhage Acute encephalopathy P:   RASS goal: 0 Continue precedex for comfort/vent synchrony   FAMILY  - Updates: plan one way extubation tomorrow after family arrives  - Inter-disciplinary family meet  or Palliative Care: note from 6/15 reviewed, appreciate their support  My cc time 31 minutes  Heber [place], MD [place] [facility] Pager: [phone] Cell: [phone] After 3pm or if no response, call [phone]   01/12/2017, 12:55 PM

## 2017-01-13 DIAGNOSIS — Z66 Do not resuscitate: Secondary | ICD-10-CM

## 2017-01-13 DIAGNOSIS — Z515 Encounter for palliative care: Secondary | ICD-10-CM

## 2017-01-13 DIAGNOSIS — Z7189 Other specified counseling: Secondary | ICD-10-CM

## 2017-01-13 DIAGNOSIS — I729 Aneurysm of unspecified site: Secondary | ICD-10-CM

## 2017-01-13 DIAGNOSIS — Z23 Encounter for immunization: Secondary | ICD-10-CM | POA: Diagnosis not present

## 2017-01-13 LAB — CBC WITH DIFFERENTIAL/PLATELET
BASOS ABS: 0 10*3/uL (ref 0.0–0.1)
BASOS PCT: 0 %
EOS ABS: 0.3 10*3/uL (ref 0.0–0.7)
Eosinophils Relative: 3 %
HEMATOCRIT: 34 % — AB (ref 36.0–46.0)
Hemoglobin: 10.8 g/dL — ABNORMAL LOW (ref 12.0–15.0)
Lymphocytes Relative: 16 %
Lymphs Abs: 1.8 10*3/uL (ref 0.7–4.0)
MCH: 27.8 pg (ref 26.0–34.0)
MCHC: 31.8 g/dL (ref 30.0–36.0)
MCV: 87.4 fL (ref 78.0–100.0)
Monocytes Absolute: 0.8 10*3/uL (ref 0.1–1.0)
Monocytes Relative: 7 %
NEUTROS ABS: 8.6 10*3/uL — AB (ref 1.7–7.7)
Neutrophils Relative %: 74 %
Platelets: 304 10*3/uL (ref 150–400)
RBC: 3.89 MIL/uL (ref 3.87–5.11)
RDW: 14.9 % (ref 11.5–15.5)
WBC: 11.5 10*3/uL — AB (ref 4.0–10.5)

## 2017-01-13 LAB — RENAL FUNCTION PANEL
ALBUMIN: 2 g/dL — AB (ref 3.5–5.0)
ANION GAP: 4 — AB (ref 5–15)
BUN: 20 mg/dL (ref 6–20)
CALCIUM: 8.3 mg/dL — AB (ref 8.9–10.3)
CO2: 18 mmol/L — ABNORMAL LOW (ref 22–32)
CREATININE: 0.94 mg/dL (ref 0.44–1.00)
Chloride: 128 mmol/L — ABNORMAL HIGH (ref 101–111)
Glucose, Bld: 120 mg/dL — ABNORMAL HIGH (ref 65–99)
PHOSPHORUS: 3.3 mg/dL (ref 2.5–4.6)
Potassium: 3.1 mmol/L — ABNORMAL LOW (ref 3.5–5.1)
SODIUM: 150 mmol/L — AB (ref 135–145)

## 2017-01-13 LAB — BASIC METABOLIC PANEL
Anion gap: 5 (ref 5–15)
BUN: 20 mg/dL (ref 6–20)
CO2: 17 mmol/L — ABNORMAL LOW (ref 22–32)
CREATININE: 0.93 mg/dL (ref 0.44–1.00)
Calcium: 8.3 mg/dL — ABNORMAL LOW (ref 8.9–10.3)
Chloride: 129 mmol/L — ABNORMAL HIGH (ref 101–111)
Glucose, Bld: 121 mg/dL — ABNORMAL HIGH (ref 65–99)
POTASSIUM: 3.1 mmol/L — AB (ref 3.5–5.1)
SODIUM: 151 mmol/L — AB (ref 135–145)

## 2017-01-13 LAB — MAGNESIUM: MAGNESIUM: 2.1 mg/dL (ref 1.7–2.4)

## 2017-01-13 LAB — CULTURE, BLOOD (ROUTINE X 2)
CULTURE: NO GROWTH
Culture: NO GROWTH
SPECIAL REQUESTS: ADEQUATE
SPECIAL REQUESTS: ADEQUATE

## 2017-01-13 LAB — GLUCOSE, CAPILLARY
GLUCOSE-CAPILLARY: 131 mg/dL — AB (ref 65–99)
GLUCOSE-CAPILLARY: 106 mg/dL — AB (ref 65–99)

## 2017-01-13 MED ORDER — GLYCOPYRROLATE 0.2 MG/ML IJ SOLN: 0.2000 mg | INTRAMUSCULAR | Status: DC | PRN

## 2017-01-13 MED ORDER — GLYCOPYRROLATE 1 MG PO TABS: 1.0000 mg | ORAL_TABLET | ORAL | Status: DC | PRN

## 2017-01-13 MED ORDER — FENTANYL CITRATE (PF) 100 MCG/2ML IJ SOLN: 50.0000 ug | INTRAMUSCULAR | Status: DC | PRN

## 2017-01-13 MED ORDER — MIDAZOLAM BOLUS VIA INFUSION (WITHDRAWAL LIFE SUSTAINING TX)
5.0000 mg | INTRAVENOUS | Status: DC | PRN
  Filled 2017-01-13: qty 20

## 2017-01-13 MED ORDER — SODIUM CHLORIDE 0.9 % IV SOLN
10.0000 mg/h | INTRAVENOUS | Status: DC
  Administered 2017-01-13: 10 mg/h via INTRAVENOUS
  Filled 2017-01-13: qty 10

## 2017-01-13 MED ORDER — CHLORHEXIDINE GLUCONATE 0.12 % MT SOLN
OROMUCOSAL | Status: AC
Start: 2017-01-13 — End: 2017-01-13
  Filled 2017-01-13: qty 15

## 2017-01-13 MED ORDER — FENTANYL BOLUS VIA INFUSION
50.0000 ug | INTRAVENOUS | Status: DC | PRN
  Filled 2017-01-13: qty 100

## 2017-01-15 DIAGNOSIS — J9690 Respiratory failure, unspecified, unspecified whether with hypoxia or hypercapnia: Secondary | ICD-10-CM

## 2017-01-27 NOTE — Progress Notes (Signed)
   01/09/2017 1455  Notifications  Bed Control notified that Post Mortem checklist is complete Yes  Bed Control notified body transferred Transported to morgue  Attending Physican Contact  Attending Physician Notified Y  Attending Physician Name Christene Slatese Dios  Will the above attending physician sign death certificate?  (non ED physician) Yes  Post Mortem Checklist  Date of Death 01/02/2017  Time of Death 1501  Pronounced By Sullivan LoneJamie White, RN & Talbot GrumblingFrancisca Okafor, RN  Next of kin notified Yes  Name of next of kin notified of death Mayra Annamaria HellingCorrea  Contact Person's Relationship to Patient Daughter  Contact Person's Phone Number [phone]  Contact Person's address [address] [place] [phone]  Family Communication Notes family at bedside at time of death  Was the patient a No Code Blue or a Limited Code Blue? Yes  Did the patient die unattended? No  Patient restrained? Not applicable  Weight 93.8 kg (206 lb 12.7 oz)  Body preparation complete Y  [place] [facility]  Notification Date 01/07/14  Notification Time 1830  [place] [facility] Number [phone]  Is patient a potential donor? Y  Donation Type Tissue  Eye prep completed (n/a)  Autopsy  Autopsy requested by N/A  Patient Belongings/Medications Returned  Patient belongings from bedside/safe/pharmacy returned  None  Dead on Arrival (Emergency Department)  Patient dead on arrival? No  Medical Examiner  Is this a medical examiner's case? [facility]  Funeral Home  Funeral home name/address/phone # [employer] - [address] [place]- [phone]  Planned location of pickup Morgue  Pt transported to morgue at current time

## 2017-01-27 NOTE — Progress Notes (Signed)
Daily Progress Note   Patient Name: Sherri Hale       Date: 02-11-2017 DOB: Dec 23, 1950  Age: 67 y.o. MRN#: [id number] Attending Physician: Corrie Dandy, [place] Primary Care Physician: No primary care provider on file. Admit Date: 01/19/2017  Reason for Consultation/Follow-up: Establishing goals of care, Interfamily conflict, Psychosocial/spiritual support, Terminal Care and Withdrawal of life-sustaining treatment  Subjective:  Met with patient's daughter, son, 2 sister's and brother. A great deal of time was spent answering their questions related to patient's current disease state and likely trajectory. Multidisciplinary family meeting was also held with Dr. Christella Noa from nuerosurgery, and patient's RN, Roselyn Reef. Patient does not have likelihood of meaningful functional recovery. Reviewed with family differing paths of continued aggressive care (trach, PEG, IV medications for blood pressure) vs transition of care to comfort, (compassionate extubation and support through dying process with focus of care on relief of symptoms of suffering and pain). After protracted follow up discussions, family made decision that patient would prefer to transition to comfort care with compassionate extubation.   Gave support to family as they  Review of Systems  Unable to perform ROS: Critical illness    Length of Stay: 11  Current Medications: Scheduled Meds:    Continuous Infusions: . sodium chloride Stopped (01/10/17 1736)  . fentaNYL infusion INTRAVENOUS 40 mcg/hr (February 11, 2017 1100)  . midazolam (VERSED) infusion      PRN Meds: fentaNYL, fentaNYL (SUBLIMAZE) injection, midazolam  Physical Exam  Cardiovascular: Normal rate and regular rhythm.   Diffuse anasarca  Pulmonary/Chest:  Intubated  with full ventilator support  Abdominal: She exhibits distension.  Neurological:  Does not follow commands, does not open eyes to voice, responds to noxious stimuli only  Nursing note and vitals reviewed.           Vital Signs: BP 131/71   Pulse 60   Temp 98.5 F (36.9 C) (Axillary)   Resp 20   Ht 5' 3"  (1.6 m)   Wt 93.8 kg (206 lb 12.7 oz)   LMP  (LMP Unknown)   SpO2 97%   BMI 36.63 kg/m  SpO2: SpO2: 97 % O2 Device: O2 Device: Ventilator O2 Flow Rate: O2 Flow Rate (L/min): 15 L/min  Intake/output summary:  Intake/Output Summary (Last 24 hours) at 02/11/2017 1216 Last data filed at 02/11/17 1100  Gross per 24 hour  Intake          3541.83 ml  Output             2950 ml  Net           591.83 ml   LBM: Last BM Date: 01/12/17 Baseline Weight: Weight: 81.6 kg (180 lb) Most recent weight: Weight: 93.8 kg (206 lb 12.7 oz)       Palliative Assessment/Data: PPS: 10%    Flowsheet Rows     Most Recent Value  Intake Tab  Referral Department  Critical care  Unit at Time of Referral  ICU  Palliative Care Primary Diagnosis  Neurology  Date Notified  01/08/17  Palliative Care Type  New Palliative care  Reason for referral  Clarify Goals of Care  Date of Admission  12/30/2016  Date first seen by Palliative Care  01/08/17  # of days IP prior to Palliative referral  6  Clinical Assessment  Palliative Performance Scale Score  10%  Dyspnea Max Last 24 Hours  6  Psychosocial & Spiritual Assessment  Palliative Care Outcomes      Patient Active Problem List   Diagnosis Date Noted  . Goals of care, counseling/discussion   . Palliative care by specialist   . DNR (do not resuscitate)   . 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 SAH (subarachnoid hemorrhage) ([place]) 01/09/2017  . Subarachnoid hemorrhage due to ruptured aneurysm ([place]) 01/05/2017  . Acute respiratory failure [facility])     Palliative Care Assessment & Plan   Patient Profile: 66 yr old female with PMH of HTN and  pacemaker, admitted on 01/22/2017 with subarachnoid hemorrhage d/t aneurysm s/p coil placement. She was unresponsive on arrival and required intubation for respiratory failure. CXR on 6/10 showed bilateral medial lobe consolidation and likely HCAP. She has showed no signs of neurologic recovery during admission.   Assessment/Recommendations/Plan   SAH/Acute Respiratory failure- transition to comfort measures  One way extubation  Fentanyl infusion and bolus as ordered  Versed infusion and bolus as ordered  Discontinue all interventions that are not implemented with purpose of comfort  Goals of Care and Additional Recommendations:  Limitations on Scope of Treatment: Full Comfort Care  Code Status:  DNR  Prognosis:   Hours - Days  Discharge Planning:  Anticipated Hospital Death  Care plan was discussed with patient's family and Roselyn Reef, Therapist, sports.  Thank you for allowing the Palliative Medicine Team to assist in the care of this patient.   Time In: 1000 Time Out: 1215 Total Time 135 mins Prolonged Time Billed Yes      Greater than 50%  of this time was spent counseling and coordinating care related to the above assessment and plan.  Mariana Kaufman, AGNP-C Palliative Medicine   Please contact Palliative Medicine Team phone at [phone] for questions and concerns.

## 2017-01-27 NOTE — Progress Notes (Signed)
Patient ID: Sherri Hale, female   DOB: Jan 23, 1951, 66 y.o.   MRN: [id number] BP 124/65   Pulse 60   Temp 98.5 F (36.9 C) (Axillary)   Resp (!) 4   Ht 5\' 3"  (1.6 m)   Wt 93.8 kg (206 lb 12.7 oz)   LMP  (LMP Unknown)   SpO2 98%   BMI 36.63 kg/m  Not responsive No improvement I spoke at great length with her family this morning. I have explained that to the best of our knowledge that Sherri Hale will not improve from her current exam. She is not brain dead, but it is very unlikely that she will improve.  They will consult with each other and let us know their decision about comfort care.

## 2017-01-27 NOTE — Progress Notes (Signed)
150 cc Fentanyl and 10 cc Versed wasted in sink post mortem as witnessed by Vira BrownsFransisca Okafor, RN.

## 2017-01-27 NOTE — Death Summary Note (Signed)
DEATH SUMMARY   Patient Details  Name: Sherri Hale MRN: [id number] DOB: 28-Dec-1950  Admission/Discharge Information   Admit Date:  2017/01/04  Date of Death: Date of Death: January 15, 2017  Time of Death: Time of Death: 1499-12-07  Length of Stay: Dec 10, 2022  Referring Physician: No primary care provider on file.   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 year old female who  Was initially admitted to the hospital with subarachnoid hemorrhage diagnosed by CT scan.  She was  Minimally conscious on presentation, and required intubation.  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  She was closely monitored in the intensive care unit,  With ventilator support and continuous supportive care.  Unfortunately, she did not make any appreciable neurologic improvement.  Given her initial presentation and lack of neurologic improvement, it was  Agreed by myself, neurology service, and Pulmonary Critical Care service that her prognosis was grim.  Options were discussed with the patient's family including long-term care versus terminal wean and palliative care.  They elected to extubate, and she expired shortly thereafter.    Pertinent Labs and Studies  Significant Diagnostic Studies Ct Head Wo Contrast  Result Date: 04-Jan-2017 CLINICAL DATA:   Fall with vomiting. EXAM: CT HEAD WITHOUT CONTRAST TECHNIQUE: Contiguous axial images were obtained from the base of the skull through the vertex without intravenous contrast. COMPARISON:  None. FINDINGS: Brain: There is diffuse subarachnoid hemorrhage over both convexities and extending into the sylvian fissures and basal cisterns. The largest concentration of blood is along the anterior interhemispheric fissure. It is unclear if there is some parenchymal component of this location as well. There is mass effect on the right lateral ventricle but no midline shift. Basal cisterns are effaced and there is crowding of the foramen magnum with early/impending herniation of cerebellar tonsils. There is intraventricular extension of blood within the lateral ventricles near the foramen of Monro and in the third ventricle. Vascular: No hyperdense vessel or unexpected calcification. Skull: Normal visualized skull base, calvarium and extracranial soft tissues. Sinuses/Orbits: No sinus fluid levels or advanced mucosal thickening. No mastoid effusion. Normal orbits. IMPRESSION: 1. Massive subarachnoid hemorrhage over both convexities and within the sylvian fissures, basal cisterns and the lateral and third ventricles. Given the distribution, an aneurysmal etiology is favored, though other entities may also cause this distribution. However, it is somewhat unclear whether there may be a component of intraparenchymal hemorrhage of the inferomedial frontal lobe. CTA of the head is recommended. 2. Effacement of the basal cisterns and crowding of the foramen magnum with early/impending herniation of the cerebellar tonsils. Critical Value/emergent results were called by telephone at the time of interpretation on 01-04-2017 at 6:58 pm to Dr. Crista Curb , who verbally acknowledged these results. Electronically Signed   By: Deatra Robinson M.D.   On: January 04, 2017 19:07   Ir Transcath/emboliz  Result Date: 01/06/2017 CLINICAL DATA:  The patient  is a 66 year old woman presenting to the hospital with loss of consciousness after sudden onset of severe headache. CT scan demonstrated diffuse subarachnoid hemorrhage. She therefore presents for further workup with diagnostic cerebral angiogram and possible aneurysm coiling. OPERATOR: Dr. Lisbeth Renshaw EXAM: DIAGNOSTIC CEREBRAL ANGIOGRAM COIL EMBOLIZATION OF ANTERIOR COMMUNICATING ARTERY ANEURYSM PROCEDURE: The technical aspects of the procedure as well as its potential risks and benefits were reviewed with the patient. These risks included but were not limited to bleeding, infection, allergic reaction, damage to organs/vital structures, stroke, inability to repair vessel/place stent, delayed aneurysm rupture, and catastrophic outcomes of heart attack, coma, and death. With the understanding of these risks, informed consent was obtained and witnessed. The patient was placed in the supine position on the angiography table and the skin of the right groin was prepped in the usual sterile fashion. The procedure was performed under general anesthesia provided by the anesthesiology department. An 8 French sheath was introduced in the right femoral artery using Seldinger technique. Heparin: 0 units Fluoroscopy Time:  See IR records Contrast:  See IR records CATHETERS AND WIRES: 5-French JB-1 glidecatheter 180 cm 0.035" glidewire 6-French NeuronMax guide sheath 6-French Berenstein Select catheter 0.058" CatV guidecatheter 150 cm Marksman microcatheter Synchro 2 standard microwire Excelsior XT-17 straight microcatheter COILS USED (MRI compatible): Target 360 3x6 Nano Target 360 2.5 x 4 Nano x4 VESSELS CATHETERIZED: Right internal carotid Left internal carotid Left vertebral artery Right anterior cerebral artery, A2 segment VESSELS STUDIED: Right internal carotid, head Left internal carotid, head Left vertebral artery, head Right internal carotid artery (pre embolization ) Right internal carotid artery (during  embolization ) Right internal carotid artery (immediate post embolization ) Right internal carotid artery final control PROCEDURAL DESCRIPTION: The JB 1 catheter was introduced over the Glidewire, and the right internal carotid, left internal carotid, and left vertebral artery were sequentially catheterized with cerebral angiograms taken. After review of the images, the catheter was removed without incident. Under real-time fluoroscopy, the select catheter within the NeuronMax sheath was introduced and advanced into the right internal carotid artery over an 0.035" guidewire. The Shuttle sheath was then advanced into the proximal right internal carotid artery. The Select catheter was removed, and repeat roadmap was obtained. A Marksman microcatheter was then introduced and under roadmap guidance was advanced over a Synchro 2 standard microwire into the distal M1 segment on the right. The CatV catheter was then tracked over the Marksman to its final position in the horizontal cavernous internal carotid. The Marksman was then removed and the coiling catheter introduced over the microwire. The catheter was then navigated into the aneurysm lumen over the microwire. The wire was then removed. The above coils were then sequentially deployed with progressive exclusion of the aneurysm. Angiograms were intermittently taken after coil deployment. Cerebral angiography was performed immediately post embolization. The coiling catheter was then removed and the CatV withdrawn into the cervical internal carotid artery. Final control angiography was then performed from the Navien guide catheter. The guide catheter and sheath were then synchronously removed without incident. The 8-Fr sheath was maintained on continuous flush and sutured in place. INTERPRETATION: Right femoral artery: The right femoral artery is unremarkable, arterial sheath in adequate position. Right internal carotid, head Injection reveals a widely patent right  internal carotid artery, which has normal bifurcation into anterior and middle cerebral arteries. The distal branches of the ACA and MCA are unremarkable. The anterior communicating artery complex is identified, with a anteriorly projecting aneurysm seen. This aneurysm  measures approximately 4.0 x 3.0 mm. It has relatively narrow neck arising from the anterior communicating artery itself. Capillary phase does not demonstrate any perfusion deficits. Venous phase is unremarkable. Left internal carotid Injection reveals a widely patent left internal carotid artery with normal bifurcation into anterior and middle cerebral arteries. 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 Distal branches of the ACA and MCA are unremarkable. No other aneurysms, AVM, or fistulas are seen. Capillary phase does not demonstrate any perfusion deficits. Venous phase is unremarkable. Left vertebral Left vertebral artery is widely patent, as is the basilar artery. The basilar bifurcation is normal. Posterior cerebral arteries are unremarkable. There is contrast reflux down the contralateral vertebral artery. No aneurysms are identified. There are no perfusion deficits. Venous phase is unremarkable. Right internal carotid artery, head (pre-embolization): Injection reveals the presence of a widely patent ICA that leads to a patent ACA and MCA. 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 The visualized portions of capillary and venous phases are unremarkable. Right internal carotid artery, head (during embolization): Angiograms taken during coil embolization demonstrate good position of the coil mass, without evidence of coil prolapse into the anterior cerebral arteries or anterior communicating artery. No filling defects are seen. Right internal carotid artery, head (immediate post-embolization): Injection reveals the presence of a widely patent  ICA that leads to a patent ACA and MCA. Coil mass within the aneurysm is stable, without coil prolapse or filling defect to suggest thrombus. There does not appear to be any significant aneurysm filling seen. Right internal carotid artery, head (final control): Injection reveals the presence of a widely patent ICA that leads to a patent ACA and MCA. No thrombus is visualized. Coil mass is seen within the aneurysm without any aneurysm filling seen. Anterior communicating artery remains patent, with filling of the contralateral anterior circulation seen. The parenchymal and venous phases are unremarkable. DISPOSITION: After completion of the procedure, the femoral sheath was replaced with a 7 [place] ExoSeal closure device which was deployed successfully. The procedure was well tolerated and no early complications were observed. The patient was transferred to the neuro intensive care unit for further care. IMPRESSION: 1. 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 No aneurysm filling is seen post-embolization. 2. No other aneurysms, AVM, or high-flow fistulas are seen. Electronically Signed   By: Lisbeth Renshaw   On: 01/09/2017 17:10   Ir Angiogram Follow Up Study  Result Date: 12/28/2016 CLINICAL DATA:  The patient is a 66 year old woman presenting to the hospital with loss of consciousness after sudden onset of severe headache. CT scan demonstrated diffuse subarachnoid hemorrhage. She therefore presents for further workup with diagnostic cerebral angiogram and possible aneurysm coiling. OPERATOR: Dr. Lisbeth Renshaw EXAM: DIAGNOSTIC CEREBRAL ANGIOGRAM COIL EMBOLIZATION OF ANTERIOR COMMUNICATING ARTERY ANEURYSM PROCEDURE: The technical aspects of the procedure as well as its potential risks and benefits were reviewed with the patient. These risks included but were not limited to bleeding,  infection, allergic reaction, damage to organs/vital structures, stroke, inability to repair vessel/place stent, delayed aneurysm rupture, and catastrophic outcomes of heart attack, coma, and death. With the understanding of these risks, informed consent was obtained and witnessed. The patient was placed in the supine position on the angiography table and the skin of the right groin was prepped in the usual sterile fashion. The procedure was performed under general anesthesia provided by  the anesthesiology department. An 8 French sheath was introduced in the right femoral artery using Seldinger technique. Heparin: 0 units Fluoroscopy Time:  See IR records Contrast:  See IR records CATHETERS AND WIRES: 5-French JB-1 glidecatheter 180 cm 0.035" glidewire 6-French NeuronMax guide sheath 6-French Berenstein Select catheter 0.058" CatV guidecatheter 150 cm Marksman microcatheter Synchro 2 standard microwire Excelsior XT-17 straight microcatheter COILS USED (MRI compatible): Target 360 3x6 Nano Target 360 2.5 x 4 Nano x4 VESSELS CATHETERIZED: Right internal carotid Left internal carotid Left vertebral artery Right anterior cerebral artery, A2 segment VESSELS STUDIED: Right internal carotid, head Left internal carotid, head Left vertebral artery, head Right internal carotid artery (pre embolization ) Right internal carotid artery (during embolization ) Right internal carotid artery (immediate post embolization ) Right internal carotid artery final control PROCEDURAL DESCRIPTION: The JB 1 catheter was introduced over the Glidewire, and the right internal carotid, left internal carotid, and left vertebral artery were sequentially catheterized with cerebral angiograms taken. After review of the images, the catheter was removed without incident. Under real-time fluoroscopy, the select catheter within the NeuronMax sheath was introduced and advanced into the right internal carotid artery over an 0.035" guidewire. The Shuttle  sheath was then advanced into the proximal right internal carotid artery. The Select catheter was removed, and repeat roadmap was obtained. A Marksman microcatheter was then introduced and under roadmap guidance was advanced over a Synchro 2 standard microwire into the distal M1 segment on the right. The CatV catheter was then tracked over the Marksman to its final position in the horizontal cavernous internal carotid. The Marksman was then removed and the coiling catheter introduced over the microwire. The catheter was then navigated into the aneurysm lumen over the microwire. The wire was then removed. The above coils were then sequentially deployed with progressive exclusion of the aneurysm. Angiograms were intermittently taken after coil deployment. Cerebral angiography was performed immediately post embolization. The coiling catheter was then removed and the CatV withdrawn into the cervical internal carotid artery. Final control angiography was then performed from the Navien guide catheter. The guide catheter and sheath were then synchronously removed without incident. The 8-Fr sheath was maintained on continuous flush and sutured in place. INTERPRETATION: Right femoral artery: The right femoral artery is unremarkable, arterial sheath in adequate position. Right internal carotid, head Injection reveals a widely patent right internal carotid artery, which has normal bifurcation into anterior and middle cerebral arteries. The distal branches of the ACA and MCA are unremarkable. The anterior communicating artery complex is identified, with a anteriorly projecting aneurysm seen. This aneurysm measures approximately 4.0 x 3.0 mm. It has relatively narrow neck arising from the anterior communicating artery itself. Capillary phase does not demonstrate any perfusion deficits. Venous phase is unremarkable. Left internal carotid Injection reveals a widely patent left internal carotid artery with normal bifurcation into  anterior and middle cerebral arteries. 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 Distal branches of the ACA and MCA are unremarkable. No other aneurysms, AVM, or fistulas are seen. Capillary phase does not demonstrate any perfusion deficits. Venous phase is unremarkable. Left vertebral Left vertebral artery is widely patent, as is the basilar artery. The basilar bifurcation is normal. Posterior cerebral arteries are unremarkable. There is contrast reflux down the contralateral vertebral artery. No aneurysms are identified. There are no perfusion deficits. Venous phase is unremarkable. Right internal carotid artery, head (pre-embolization): Injection reveals the presence of a widely patent ICA  that leads to a patent ACA and MCA. 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 The visualized portions of capillary and venous phases are unremarkable. Right internal carotid artery, head (during embolization): Angiograms taken during coil embolization demonstrate good position of the coil mass, without evidence of coil prolapse into the anterior cerebral arteries or anterior communicating artery. No filling defects are seen. Right internal carotid artery, head (immediate post-embolization): Injection reveals the presence of a widely patent ICA that leads to a patent ACA and MCA. Coil mass within the aneurysm is stable, without coil prolapse or filling defect to suggest thrombus. There does not appear to be any significant aneurysm filling seen. Right internal carotid artery, head (final control): Injection reveals the presence of a widely patent ICA that leads to a patent ACA and MCA. No thrombus is visualized. Coil mass is seen within the aneurysm without any aneurysm filling seen. Anterior communicating artery remains patent, with filling of the contralateral anterior circulation seen. The parenchymal and venous phases are  unremarkable. DISPOSITION: After completion of the procedure, the femoral sheath was replaced with a 7 [place] ExoSeal closure device which was deployed successfully. The procedure was well tolerated and no early complications were observed. The patient was transferred to the neuro intensive care unit for further care. IMPRESSION: 1. 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 No aneurysm filling is seen post-embolization. 2. No other aneurysms, AVM, or high-flow fistulas are seen. Electronically Signed   By: Lisbeth Renshaw   On: 01/19/2017 17:10   Ir Angiogram Follow Up Study  Result Date: 12/28/2016 CLINICAL DATA:  The patient is a 66 year old woman presenting to the hospital with loss of consciousness after sudden onset of severe headache. CT scan demonstrated diffuse subarachnoid hemorrhage. She therefore presents for further workup with diagnostic cerebral angiogram and possible aneurysm coiling. OPERATOR: Dr. Lisbeth Renshaw EXAM: DIAGNOSTIC CEREBRAL ANGIOGRAM COIL EMBOLIZATION OF ANTERIOR COMMUNICATING ARTERY ANEURYSM PROCEDURE: The technical aspects of the procedure as well as its potential risks and benefits were reviewed with the patient. These risks included but were not limited to bleeding, infection, allergic reaction, damage to organs/vital structures, stroke, inability to repair vessel/place stent, delayed aneurysm rupture, and catastrophic outcomes of heart attack, coma, and death. With the understanding of these risks, informed consent was obtained and witnessed. The patient was placed in the supine position on the angiography table and the skin of the right groin was prepped in the usual sterile fashion. The procedure was performed under general anesthesia provided by the anesthesiology department. An 8 French sheath was introduced in the right femoral artery using Seldinger  technique. Heparin: 0 units Fluoroscopy Time:  See IR records Contrast:  See IR records CATHETERS AND WIRES: 5-French JB-1 glidecatheter 180 cm 0.035" glidewire 6-French NeuronMax guide sheath 6-French Berenstein Select catheter 0.058" CatV guidecatheter 150 cm Marksman microcatheter Synchro 2 standard microwire Excelsior XT-17 straight microcatheter COILS USED (MRI compatible): Target 360 3x6 Nano Target 360 2.5 x 4 Nano x4 VESSELS CATHETERIZED: Right internal carotid Left internal carotid Left vertebral artery Right anterior cerebral artery, A2 segment VESSELS STUDIED: Right internal carotid, head Left internal carotid, head Left vertebral artery, head Right internal carotid artery (pre embolization ) Right internal carotid artery (during embolization ) Right internal carotid artery (immediate post embolization ) Right internal carotid artery final control PROCEDURAL DESCRIPTION: The JB 1 catheter was introduced over the Glidewire, and the right internal carotid, left internal carotid,  and left vertebral artery were sequentially catheterized with cerebral angiograms taken. After review of the images, the catheter was removed without incident. Under real-time fluoroscopy, the select catheter within the NeuronMax sheath was introduced and advanced into the right internal carotid artery over an 0.035" guidewire. The Shuttle sheath was then advanced into the proximal right internal carotid artery. The Select catheter was removed, and repeat roadmap was obtained. A Marksman microcatheter was then introduced and under roadmap guidance was advanced over a Synchro 2 standard microwire into the distal M1 segment on the right. The CatV catheter was then tracked over the Marksman to its final position in the horizontal cavernous internal carotid. The Marksman was then removed and the coiling catheter introduced over the microwire. The catheter was then navigated into the aneurysm lumen over the microwire. The wire was then  removed. The above coils were then sequentially deployed with progressive exclusion of the aneurysm. Angiograms were intermittently taken after coil deployment. Cerebral angiography was performed immediately post embolization. The coiling catheter was then removed and the CatV withdrawn into the cervical internal carotid artery. Final control angiography was then performed from the Navien guide catheter. The guide catheter and sheath were then synchronously removed without incident. The 8-Fr sheath was maintained on continuous flush and sutured in place. INTERPRETATION: Right femoral artery: The right femoral artery is unremarkable, arterial sheath in adequate position. Right internal carotid, head Injection reveals a widely patent right internal carotid artery, which has normal bifurcation into anterior and middle cerebral arteries. The distal branches of the ACA and MCA are unremarkable. The anterior communicating artery complex is identified, with a anteriorly projecting aneurysm seen. This aneurysm measures approximately 4.0 x 3.0 mm. It has relatively narrow neck arising from the anterior communicating artery itself. Capillary phase does not demonstrate any perfusion deficits. Venous phase is unremarkable. Left internal carotid Injection reveals a widely patent left internal carotid artery with normal bifurcation into anterior and middle cerebral arteries. 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 Distal branches of the ACA and MCA are unremarkable. No other aneurysms, AVM, or fistulas are seen. Capillary phase does not demonstrate any perfusion deficits. Venous phase is unremarkable. Left vertebral Left vertebral artery is widely patent, as is the basilar artery. The basilar bifurcation is normal. Posterior cerebral arteries are unremarkable. There is contrast reflux down the contralateral vertebral artery. No aneurysms are  identified. There are no perfusion deficits. Venous phase is unremarkable. Right internal carotid artery, head (pre-embolization): Injection reveals the presence of a widely patent ICA that leads to a patent ACA and MCA. 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 The visualized portions of capillary and venous phases are unremarkable. Right internal carotid artery, head (during embolization): Angiograms taken during coil embolization demonstrate good position of the coil mass, without evidence of coil prolapse into the anterior cerebral arteries or anterior communicating artery. No filling defects are seen. Right internal carotid artery, head (immediate post-embolization): Injection reveals the presence of a widely patent ICA that leads to a patent ACA and MCA. Coil mass within the aneurysm is stable, without coil prolapse or filling defect to suggest thrombus. There does not appear to be any significant aneurysm filling seen. Right internal carotid artery, head (final control): Injection reveals the presence of a widely patent ICA that leads to a patent ACA and MCA. No thrombus is visualized. Coil mass is seen within the aneurysm without any aneurysm filling seen. Anterior communicating  artery remains patent, with filling of the contralateral anterior circulation seen. The parenchymal and venous phases are unremarkable. DISPOSITION: After completion of the procedure, the femoral sheath was replaced with a 7 [place] ExoSeal closure device which was deployed successfully. The procedure was well tolerated and no early complications were observed. The patient was transferred to the neuro intensive care unit for further care. IMPRESSION: 1. 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 No aneurysm filling is seen post-embolization. 2. No other aneurysms, AVM, or high-flow fistulas are seen.  Electronically Signed   By: Lisbeth Renshaw   On: 18-Jan-2017 17:10   Ir Angiogram Follow Up Study  Result Date: 2017/01/18 CLINICAL DATA:  The patient is a 66 year old woman presenting to the hospital with loss of consciousness after sudden onset of severe headache. CT scan demonstrated diffuse subarachnoid hemorrhage. She therefore presents for further workup with diagnostic cerebral angiogram and possible aneurysm coiling. OPERATOR: Dr. Lisbeth Renshaw EXAM: DIAGNOSTIC CEREBRAL ANGIOGRAM COIL EMBOLIZATION OF ANTERIOR COMMUNICATING ARTERY ANEURYSM PROCEDURE: The technical aspects of the procedure as well as its potential risks and benefits were reviewed with the patient. These risks included but were not limited to bleeding, infection, allergic reaction, damage to organs/vital structures, stroke, inability to repair vessel/place stent, delayed aneurysm rupture, and catastrophic outcomes of heart attack, coma, and death. With the understanding of these risks, informed consent was obtained and witnessed. The patient was placed in the supine position on the angiography table and the skin of the right groin was prepped in the usual sterile fashion. The procedure was performed under general anesthesia provided by the anesthesiology department. An 8 French sheath was introduced in the right femoral artery using Seldinger technique. Heparin: 0 units Fluoroscopy Time:  See IR records Contrast:  See IR records CATHETERS AND WIRES: 5-French JB-1 glidecatheter 180 cm 0.035" glidewire 6-French NeuronMax guide sheath 6-French Berenstein Select catheter 0.058" CatV guidecatheter 150 cm Marksman microcatheter Synchro 2 standard microwire Excelsior XT-17 straight microcatheter COILS USED (MRI compatible): Target 360 3x6 Nano Target 360 2.5 x 4 Nano x4 VESSELS CATHETERIZED: Right internal carotid Left internal carotid Left vertebral artery Right anterior cerebral artery, A2 segment VESSELS STUDIED: Right internal carotid,  head Left internal carotid, head Left vertebral artery, head Right internal carotid artery (pre embolization ) Right internal carotid artery (during embolization ) Right internal carotid artery (immediate post embolization ) Right internal carotid artery final control PROCEDURAL DESCRIPTION: The JB 1 catheter was introduced over the Glidewire, and the right internal carotid, left internal carotid, and left vertebral artery were sequentially catheterized with cerebral angiograms taken. After review of the images, the catheter was removed without incident. Under real-time fluoroscopy, the select catheter within the NeuronMax sheath was introduced and advanced into the right internal carotid artery over an 0.035" guidewire. The Shuttle sheath was then advanced into the proximal right internal carotid artery. The Select catheter was removed, and repeat roadmap was obtained. A Marksman microcatheter was then introduced and under roadmap guidance was advanced over a Synchro 2 standard microwire into the distal M1 segment on the right. The CatV catheter was then tracked over the Marksman to its final position in the horizontal cavernous internal carotid. The Marksman was then removed and the coiling catheter introduced over the microwire. The catheter was then navigated into the aneurysm lumen over the microwire. The wire was then removed. The above coils were then sequentially deployed with progressive exclusion of the aneurysm.  Angiograms were intermittently taken after coil deployment. Cerebral angiography was performed immediately post embolization. The coiling catheter was then removed and the CatV withdrawn into the cervical internal carotid artery. Final control angiography was then performed from the Navien guide catheter. The guide catheter and sheath were then synchronously removed without incident. The 8-Fr sheath was maintained on continuous flush and sutured in place. INTERPRETATION: Right femoral artery: The  right femoral artery is unremarkable, arterial sheath in adequate position. Right internal carotid, head Injection reveals a widely patent right internal carotid artery, which has normal bifurcation into anterior and middle cerebral arteries. The distal branches of the ACA and MCA are unremarkable. The anterior communicating artery complex is identified, with a anteriorly projecting aneurysm seen. This aneurysm measures approximately 4.0 x 3.0 mm. It has relatively narrow neck arising from the anterior communicating artery itself. Capillary phase does not demonstrate any perfusion deficits. Venous phase is unremarkable. Left internal carotid Injection reveals a widely patent left internal carotid artery with normal bifurcation into anterior and middle cerebral arteries. 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 Distal branches of the ACA and MCA are unremarkable. No other aneurysms, AVM, or fistulas are seen. Capillary phase does not demonstrate any perfusion deficits. Venous phase is unremarkable. Left vertebral Left vertebral artery is widely patent, as is the basilar artery. The basilar bifurcation is normal. Posterior cerebral arteries are unremarkable. There is contrast reflux down the contralateral vertebral artery. No aneurysms are identified. There are no perfusion deficits. Venous phase is unremarkable. Right internal carotid artery, head (pre-embolization): Injection reveals the presence of a widely patent ICA that leads to a patent ACA and MCA. 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 The visualized portions of capillary and venous phases are unremarkable. Right internal carotid artery, head (during embolization): Angiograms taken during coil embolization demonstrate good position of the coil mass, without evidence of coil prolapse into the anterior cerebral arteries or anterior communicating artery.  No filling defects are seen. Right internal carotid artery, head (immediate post-embolization): Injection reveals the presence of a widely patent ICA that leads to a patent ACA and MCA. Coil mass within the aneurysm is stable, without coil prolapse or filling defect to suggest thrombus. There does not appear to be any significant aneurysm filling seen. Right internal carotid artery, head (final control): Injection reveals the presence of a widely patent ICA that leads to a patent ACA and MCA. No thrombus is visualized. Coil mass is seen within the aneurysm without any aneurysm filling seen. Anterior communicating artery remains patent, with filling of the contralateral anterior circulation seen. The parenchymal and venous phases are unremarkable. DISPOSITION: After completion of the procedure, the femoral sheath was replaced with a 7 [place] ExoSeal closure device which was deployed successfully. The procedure was well tolerated and no early complications were observed. The patient was transferred to the neuro intensive care unit for further care. IMPRESSION: 1. 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 No aneurysm filling is seen post-embolization. 2. No other aneurysms, AVM, or high-flow fistulas are seen. Electronically Signed   By: Lisbeth Renshaw   On: 14-Jan-2017 17:10   Ir Angiogram Follow Up Study  Result Date: 2017-01-14 CLINICAL DATA:  The patient is a 66 year old woman presenting to the hospital with loss of consciousness after sudden onset of severe headache. CT scan demonstrated diffuse subarachnoid hemorrhage. She therefore presents  for further workup with diagnostic cerebral angiogram and possible aneurysm coiling. OPERATOR: Dr. Lisbeth Renshaw EXAM: DIAGNOSTIC CEREBRAL ANGIOGRAM COIL EMBOLIZATION OF ANTERIOR COMMUNICATING ARTERY ANEURYSM PROCEDURE: The technical aspects of the  procedure as well as its potential risks and benefits were reviewed with the patient. These risks included but were not limited to bleeding, infection, allergic reaction, damage to organs/vital structures, stroke, inability to repair vessel/place stent, delayed aneurysm rupture, and catastrophic outcomes of heart attack, coma, and death. With the understanding of these risks, informed consent was obtained and witnessed. The patient was placed in the supine position on the angiography table and the skin of the right groin was prepped in the usual sterile fashion. The procedure was performed under general anesthesia provided by the anesthesiology department. An 8 French sheath was introduced in the right femoral artery using Seldinger technique. Heparin: 0 units Fluoroscopy Time:  See IR records Contrast:  See IR records CATHETERS AND WIRES: 5-French JB-1 glidecatheter 180 cm 0.035" glidewire 6-French NeuronMax guide sheath 6-French Berenstein Select catheter 0.058" CatV guidecatheter 150 cm Marksman microcatheter Synchro 2 standard microwire Excelsior XT-17 straight microcatheter COILS USED (MRI compatible): Target 360 3x6 Nano Target 360 2.5 x 4 Nano x4 VESSELS CATHETERIZED: Right internal carotid Left internal carotid Left vertebral artery Right anterior cerebral artery, A2 segment VESSELS STUDIED: Right internal carotid, head Left internal carotid, head Left vertebral artery, head Right internal carotid artery (pre embolization ) Right internal carotid artery (during embolization ) Right internal carotid artery (immediate post embolization ) Right internal carotid artery final control PROCEDURAL DESCRIPTION: The JB 1 catheter was introduced over the Glidewire, and the right internal carotid, left internal carotid, and left vertebral artery were sequentially catheterized with cerebral angiograms taken. After review of the images, the catheter was removed without incident. Under real-time fluoroscopy, the select  catheter within the NeuronMax sheath was introduced and advanced into the right internal carotid artery over an 0.035" guidewire. The Shuttle sheath was then advanced into the proximal right internal carotid artery. The Select catheter was removed, and repeat roadmap was obtained. A Marksman microcatheter was then introduced and under roadmap guidance was advanced over a Synchro 2 standard microwire into the distal M1 segment on the right. The CatV catheter was then tracked over the Marksman to its final position in the horizontal cavernous internal carotid. The Marksman was then removed and the coiling catheter introduced over the microwire. The catheter was then navigated into the aneurysm lumen over the microwire. The wire was then removed. The above coils were then sequentially deployed with progressive exclusion of the aneurysm. Angiograms were intermittently taken after coil deployment. Cerebral angiography was performed immediately post embolization. The coiling catheter was then removed and the CatV withdrawn into the cervical internal carotid artery. Final control angiography was then performed from the Navien guide catheter. The guide catheter and sheath were then synchronously removed without incident. The 8-Fr sheath was maintained on continuous flush and sutured in place. INTERPRETATION: Right femoral artery: The right femoral artery is unremarkable, arterial sheath in adequate position. Right internal carotid, head Injection reveals a widely patent right internal carotid artery, which has normal bifurcation into anterior and middle cerebral arteries. The distal branches of the ACA and MCA are unremarkable. The anterior communicating artery complex is identified, with a anteriorly projecting aneurysm seen. This aneurysm measures approximately 4.0 x 3.0 mm. It has relatively narrow neck arising from the anterior communicating artery itself. Capillary phase does not demonstrate any perfusion deficits.  Venous phase is  unremarkable. Left internal carotid Injection reveals a widely patent left internal carotid artery with normal bifurcation into anterior and middle cerebral arteries. 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 Distal branches of the ACA and MCA are unremarkable. No other aneurysms, AVM, or fistulas are seen. Capillary phase does not demonstrate any perfusion deficits. Venous phase is unremarkable. Left vertebral Left vertebral artery is widely patent, as is the basilar artery. The basilar bifurcation is normal. Posterior cerebral arteries are unremarkable. There is contrast reflux down the contralateral vertebral artery. No aneurysms are identified. There are no perfusion deficits. Venous phase is unremarkable. Right internal carotid artery, head (pre-embolization): Injection reveals the presence of a widely patent ICA that leads to a patent ACA and MCA. 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 The visualized portions of capillary and venous phases are unremarkable. Right internal carotid artery, head (during embolization): Angiograms taken during coil embolization demonstrate good position of the coil mass, without evidence of coil prolapse into the anterior cerebral arteries or anterior communicating artery. No filling defects are seen. Right internal carotid artery, head (immediate post-embolization): Injection reveals the presence of a widely patent ICA that leads to a patent ACA and MCA. Coil mass within the aneurysm is stable, without coil prolapse or filling defect to suggest thrombus. There does not appear to be any significant aneurysm filling seen. Right internal carotid artery, head (final control): Injection reveals the presence of a widely patent ICA that leads to a patent ACA and MCA. No thrombus is visualized. Coil mass is seen within the aneurysm without any aneurysm filling seen.  Anterior communicating artery remains patent, with filling of the contralateral anterior circulation seen. The parenchymal and venous phases are unremarkable. DISPOSITION: After completion of the procedure, the femoral sheath was replaced with a 7 [place] ExoSeal closure device which was deployed successfully. The procedure was well tolerated and no early complications were observed. The patient was transferred to the neuro intensive care unit for further care. IMPRESSION: 1. 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 No aneurysm filling is seen post-embolization. 2. No other aneurysms, AVM, or high-flow fistulas are seen. Electronically Signed   By: Lisbeth Renshaw   On: January 30, 2017 17:10   Ir Angiogram Follow Up Study  Result Date: Jan 30, 2017 CLINICAL DATA:  The patient is a 66 year old woman presenting to the hospital with loss of consciousness after sudden onset of severe headache. CT scan demonstrated diffuse subarachnoid hemorrhage. She therefore presents for further workup with diagnostic cerebral angiogram and possible aneurysm coiling. OPERATOR: Dr. Lisbeth Renshaw EXAM: DIAGNOSTIC CEREBRAL ANGIOGRAM COIL EMBOLIZATION OF ANTERIOR COMMUNICATING ARTERY ANEURYSM PROCEDURE: The technical aspects of the procedure as well as its potential risks and benefits were reviewed with the patient. These risks included but were not limited to bleeding, infection, allergic reaction, damage to organs/vital structures, stroke, inability to repair vessel/place stent, delayed aneurysm rupture, and catastrophic outcomes of heart attack, coma, and death. With the understanding of these risks, informed consent was obtained and witnessed. The patient was placed in the supine position on the angiography table and the skin of the right groin was prepped in the usual sterile fashion. The procedure was performed  under general anesthesia provided by the anesthesiology department. An 8 French sheath was introduced in the right femoral artery using Seldinger technique. Heparin: 0 units Fluoroscopy Time:  See IR records Contrast:  See  IR records CATHETERS AND WIRES: 5-French JB-1 glidecatheter 180 cm 0.035" glidewire 6-French NeuronMax guide sheath 6-French Berenstein Select catheter 0.058" CatV guidecatheter 150 cm Marksman microcatheter Synchro 2 standard microwire Excelsior XT-17 straight microcatheter COILS USED (MRI compatible): Target 360 3x6 Nano Target 360 2.5 x 4 Nano x4 VESSELS CATHETERIZED: Right internal carotid Left internal carotid Left vertebral artery Right anterior cerebral artery, A2 segment VESSELS STUDIED: Right internal carotid, head Left internal carotid, head Left vertebral artery, head Right internal carotid artery (pre embolization ) Right internal carotid artery (during embolization ) Right internal carotid artery (immediate post embolization ) Right internal carotid artery final control PROCEDURAL DESCRIPTION: The JB 1 catheter was introduced over the Glidewire, and the right internal carotid, left internal carotid, and left vertebral artery were sequentially catheterized with cerebral angiograms taken. After review of the images, the catheter was removed without incident. Under real-time fluoroscopy, the select catheter within the NeuronMax sheath was introduced and advanced into the right internal carotid artery over an 0.035" guidewire. The Shuttle sheath was then advanced into the proximal right internal carotid artery. The Select catheter was removed, and repeat roadmap was obtained. A Marksman microcatheter was then introduced and under roadmap guidance was advanced over a Synchro 2 standard microwire into the distal M1 segment on the right. The CatV catheter was then tracked over the Marksman to its final position in the horizontal cavernous internal carotid. The Marksman was then removed and  the coiling catheter introduced over the microwire. The catheter was then navigated into the aneurysm lumen over the microwire. The wire was then removed. The above coils were then sequentially deployed with progressive exclusion of the aneurysm. Angiograms were intermittently taken after coil deployment. Cerebral angiography was performed immediately post embolization. The coiling catheter was then removed and the CatV withdrawn into the cervical internal carotid artery. Final control angiography was then performed from the Navien guide catheter. The guide catheter and sheath were then synchronously removed without incident. The 8-Fr sheath was maintained on continuous flush and sutured in place. INTERPRETATION: Right femoral artery: The right femoral artery is unremarkable, arterial sheath in adequate position. Right internal carotid, head Injection reveals a widely patent right internal carotid artery, which has normal bifurcation into anterior and middle cerebral arteries. The distal branches of the ACA and MCA are unremarkable. The anterior communicating artery complex is identified, with a anteriorly projecting aneurysm seen. This aneurysm measures approximately 4.0 x 3.0 mm. It has relatively narrow neck arising from the anterior communicating artery itself. Capillary phase does not demonstrate any perfusion deficits. Venous phase is unremarkable. Left internal carotid Injection reveals a widely patent left internal carotid artery with normal bifurcation into anterior and middle cerebral arteries. 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 Distal branches of the ACA and MCA are unremarkable. No other aneurysms, AVM, or fistulas are seen. Capillary phase does not demonstrate any perfusion deficits. Venous phase is unremarkable. Left vertebral Left vertebral artery is widely patent, as is the basilar artery. The basilar bifurcation  is normal. Posterior cerebral arteries are unremarkable. There is contrast reflux down the contralateral vertebral artery. No aneurysms are identified. There are no perfusion deficits. Venous phase is unremarkable. Right internal carotid artery, head (pre-embolization): Injection reveals the presence of a widely patent ICA that leads to a patent ACA and MCA. 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 The visualized portions of capillary and venous phases are unremarkable. Right internal carotid  artery, head (during embolization): Angiograms taken during coil embolization demonstrate good position of the coil mass, without evidence of coil prolapse into the anterior cerebral arteries or anterior communicating artery. No filling defects are seen. Right internal carotid artery, head (immediate post-embolization): Injection reveals the presence of a widely patent ICA that leads to a patent ACA and MCA. Coil mass within the aneurysm is stable, without coil prolapse or filling defect to suggest thrombus. There does not appear to be any significant aneurysm filling seen. Right internal carotid artery, head (final control): Injection reveals the presence of a widely patent ICA that leads to a patent ACA and MCA. No thrombus is visualized. Coil mass is seen within the aneurysm without any aneurysm filling seen. Anterior communicating artery remains patent, with filling of the contralateral anterior circulation seen. The parenchymal and venous phases are unremarkable. DISPOSITION: After completion of the procedure, the femoral sheath was replaced with a 7 [place] ExoSeal closure device which was deployed successfully. The procedure was well tolerated and no early complications were observed. The patient was transferred to the neuro intensive care unit for further care. IMPRESSION: 1. 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 No aneurysm filling is seen post-embolization. 2. No other aneurysms, AVM, or high-flow fistulas are seen. Electronically Signed   By: Lisbeth Renshaw   On: 01/11/2017 17:10   Dg Chest Port 1 View  Result Date: 01/08/2017 CLINICAL DATA:  Respiratory failure.  Intubation EXAM: PORTABLE CHEST 1 VIEW COMPARISON:  01/07/2017 FINDINGS: Endotracheal tube in good position. Central venous catheter tip in the SVC. NG tube in the stomach. Dual lead pacemaker unchanged Bibasilar airspace disease unchanged.  Small right effusion. IMPRESSION: Support lines remain in good position Bibasilar airspace disease unchanged. Electronically Signed   By: Marlan Palau M.D.   On: 01/08/2017 07:26   Dg Chest Port 1 View  Result Date: 01/07/2017 CLINICAL DATA:  Acute respiratory failure with hypoxia. EXAM: PORTABLE CHEST 1 VIEW COMPARISON:  01/06/2017 FINDINGS: Endotracheal tube is 4.2 cm above the carina. Nasogastric tube extends into the abdomen. Stable appearance of the left dual-chamber cardiac pacemaker. There continues to be hazy densities in the lower chest bilaterally. This may represent a combination of pleural fluid and atelectasis/airspace disease. Heart size is within normal limits and stable. There is a right-sided central line with the tip in the lower SVC. Negative for a pneumothorax. IMPRESSION: Persistent bibasilar chest disease. Findings may represent a combination of airspace disease/ atelectasis and pleural fluid. Support apparatuses as described. Electronically Signed   By: Richarda Overlie M.D.   On: 01/07/2017 07:37   Dg Chest Port 1 View  Result Date: 01/06/2017 CLINICAL DATA:  Hypoxia EXAM: PORTABLE CHEST 1 VIEW COMPARISON:  January 05, 2017 FINDINGS: Endotracheal tube tip is 3.4 cm above the carina. Nasogastric tube tip and side port below the diaphragm. Pacemaker leads are unchanged in position. No pneumothorax. There is patchy airspace consolidation in the medial  bases bilaterally as well as more peripherally in the right lower lobe. Lungs elsewhere clear. Heart size and pulmonary vascularity are normal. No adenopathy. No bone lesions. There is aortic atherosclerosis. IMPRESSION: Tube positions as described. No evident pneumothorax. Bibasilar consolidation as well as patchy infiltrate in the right lower lobe. Lungs elsewhere clear. Stable cardiac silhouette. There is aortic atherosclerosis. Electronically Signed   By: Bretta Bang III M.D.   On: 01/06/2017 07:27   Dg Chest [facility] [address]  Result Date: 01/05/2017 CLINICAL DATA:  Hypoxia EXAM: PORTABLE CHEST 1 VIEW COMPARISON:  January 03, 2017 FINDINGS: Endotracheal tube tip is 2.1 cm above the carina. Nasogastric tube tip and side port are below the diaphragm. No pneumothorax. Pacemaker leads are unchanged in position. There is slight atelectatic change in the left base. Lungs elsewhere clear. Heart is upper normal in size with pulmonary vascular within normal limits. There is aortic atherosclerosis. No adenopathy. No bone lesions. IMPRESSION: Tube positions as described without evident pneumothorax. Left base atelectasis. Lungs elsewhere clear. Stable cardiac silhouette. Electronically Signed   By: Bretta Bang III M.D.   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 EXAM: PORTABLE CHEST 1 VIEW COMPARISON:  Chest x-ray of January 02, 2017 FINDINGS: The lungs are adequately inflated. There is no focal infiltrate. Minimal retrocardiac increased density persists. The heart and pulmonary vascularity are normal. The mediastinum is normal in width. There is calcification in the wall of the aortic arch. The permanent pacemaker is in stable position. The endotracheal tube tip lies 2.9 cm above the carina. The esophagogastric tube tip and proximal port project below the inferior  margin of the image. The observed bony thorax exhibits no acute abnormality. IMPRESSION: No evidence of pulmonary edema today. The lung markings are coarse in the retrocardiac region which may reflect subsegmental atelectasis. The support tubes are in reasonable position. Thoracic aortic atherosclerosis. Electronically Signed   By: David  [place] M.D.   On: 12/30/2016 07:32   Dg Chest Portable 1 View  Result Date: 01/12/2017 CLINICAL DATA:  Ventilator dependent respiratory failure. Intubation. EXAM: PORTABLE CHEST 1 VIEW COMPARISON:  None. FINDINGS: Endotracheal tube tip in satisfactory position projecting approximately 3 cm above the carina. Nasogastric tube courses below the diaphragm into the stomach. Cardiac silhouette mildly enlarged for AP portable technique. Perihilar airspace opacities are present. Lungs otherwise clear. No visible pleural effusions. No pneumothorax. Left subclavian dual lead transvenous pacemaker appears intact. IMPRESSION: 1. Support apparatus satisfactory. 2. Perihilar airspace opacities likely indicating mild pulmonary edema. Mild cardiomegaly. Electronically Signed   By: Hulan Saas M.D.   On: 01/05/2017 18:30   Dg Abd Portable 1v  Result Date: 01/11/2017 CLINICAL DATA:  Ileus EXAM: PORTABLE ABDOMEN - 1 VIEW COMPARISON:  01/10/2017 FINDINGS: Nasogastric tube tip is at the gastric pylorus region. There is gaseous distension of small and large bowel consistent with ileus. The degree of distention, especially of small bowel, is mildly improved. Grossly clear lung bases. IMPRESSION: 1. Peripyloric nasogastric tube tip. 2. Ileus pattern with improvement since yesterday. Electronically Signed   By: Marnee Spring M.D.   On: 01/11/2017 13:27   Dg Abd Portable 2v  Result Date: 01/10/2017 CLINICAL DATA:  Abdominal distension, ventilated patient. Subarachnoid hemorrhage. EXAM: PORTABLE ABDOMEN - 2 VIEW COMPARISON:  There are loops of mildly distended gas-filled small present.  There is mild gaseous distention of the stomach. The esophagogastric tube tip and the proximal port appear lie below the GE junction in the gastric body and cardia respectively. The colon is relatively collapsed. No free extraluminal gas is observed. IMPRESSION: Findings compatible with a small bowel ileus or mid to distal small bowel obstruction. The nasogastric tube is in reasonable position but could be advanced an additional 5-10 cm to assure that the proximal port remains below the GE junction. Electronically Signed   By: David  [place] M.D.  On: 01/10/2017 14:13   Ir Angio Intra Extracran Sel Internal Carotid Bilat Mod Sed  Result Date: 11-Jan-2017 CLINICAL DATA:  The patient is a 66 year old woman presenting to the hospital with loss of consciousness after sudden onset of severe headache. CT scan demonstrated diffuse subarachnoid hemorrhage. She therefore presents for further workup with diagnostic cerebral angiogram and possible aneurysm coiling. OPERATOR: Dr. Lisbeth Renshaw EXAM: DIAGNOSTIC CEREBRAL ANGIOGRAM COIL EMBOLIZATION OF ANTERIOR COMMUNICATING ARTERY ANEURYSM PROCEDURE: The technical aspects of the procedure as well as its potential risks and benefits were reviewed with the patient. These risks included but were not limited to bleeding, infection, allergic reaction, damage to organs/vital structures, stroke, inability to repair vessel/place stent, delayed aneurysm rupture, and catastrophic outcomes of heart attack, coma, and death. With the understanding of these risks, informed consent was obtained and witnessed. The patient was placed in the supine position on the angiography table and the skin of the right groin was prepped in the usual sterile fashion. The procedure was performed under general anesthesia provided by the anesthesiology department. An 8 French sheath was introduced in the right femoral artery using Seldinger technique. Heparin: 0 units Fluoroscopy Time:  See IR records  Contrast:  See IR records CATHETERS AND WIRES: 5-French JB-1 glidecatheter 180 cm 0.035" glidewire 6-French NeuronMax guide sheath 6-French Berenstein Select catheter 0.058" CatV guidecatheter 150 cm Marksman microcatheter Synchro 2 standard microwire Excelsior XT-17 straight microcatheter COILS USED (MRI compatible): Target 360 3x6 Nano Target 360 2.5 x 4 Nano x4 VESSELS CATHETERIZED: Right internal carotid Left internal carotid Left vertebral artery Right anterior cerebral artery, A2 segment VESSELS STUDIED: Right internal carotid, head Left internal carotid, head Left vertebral artery, head Right internal carotid artery (pre embolization ) Right internal carotid artery (during embolization ) Right internal carotid artery (immediate post embolization ) Right internal carotid artery final control PROCEDURAL DESCRIPTION: The JB 1 catheter was introduced over the Glidewire, and the right internal carotid, left internal carotid, and left vertebral artery were sequentially catheterized with cerebral angiograms taken. After review of the images, the catheter was removed without incident. Under real-time fluoroscopy, the select catheter within the NeuronMax sheath was introduced and advanced into the right internal carotid artery over an 0.035" guidewire. The Shuttle sheath was then advanced into the proximal right internal carotid artery. The Select catheter was removed, and repeat roadmap was obtained. A Marksman microcatheter was then introduced and under roadmap guidance was advanced over a Synchro 2 standard microwire into the distal M1 segment on the right. The CatV catheter was then tracked over the Marksman to its final position in the horizontal cavernous internal carotid. The Marksman was then removed and the coiling catheter introduced over the microwire. The catheter was then navigated into the aneurysm lumen over the microwire. The wire was then removed. The above coils were then sequentially deployed with  progressive exclusion of the aneurysm. Angiograms were intermittently taken after coil deployment. Cerebral angiography was performed immediately post embolization. The coiling catheter was then removed and the CatV withdrawn into the cervical internal carotid artery. Final control angiography was then performed from the Navien guide catheter. The guide catheter and sheath were then synchronously removed without incident. The 8-Fr sheath was maintained on continuous flush and sutured in place. INTERPRETATION: Right femoral artery: The right femoral artery is unremarkable, arterial sheath in adequate position. Right internal carotid, head Injection reveals a widely patent right internal carotid artery, which has normal bifurcation into anterior and middle cerebral arteries. The distal branches of  the ACA and MCA are unremarkable. The anterior communicating artery complex is identified, with a anteriorly projecting aneurysm seen. This aneurysm measures approximately 4.0 x 3.0 mm. It has relatively narrow neck arising from the anterior communicating artery itself. Capillary phase does not demonstrate any perfusion deficits. Venous phase is unremarkable. Left internal carotid Injection reveals a widely patent left internal carotid artery with normal bifurcation into anterior and middle cerebral arteries. 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 Distal branches of the ACA and MCA are unremarkable. No other aneurysms, AVM, or fistulas are seen. Capillary phase does not demonstrate any perfusion deficits. Venous phase is unremarkable. Left vertebral Left vertebral artery is widely patent, as is the basilar artery. The basilar bifurcation is normal. Posterior cerebral arteries are unremarkable. There is contrast reflux down the contralateral vertebral artery. No aneurysms are identified. There are no perfusion deficits. Venous phase is  unremarkable. Right internal carotid artery, head (pre-embolization): Injection reveals the presence of a widely patent ICA that leads to a patent ACA and MCA. 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 The visualized portions of capillary and venous phases are unremarkable. Right internal carotid artery, head (during embolization): Angiograms taken during coil embolization demonstrate good position of the coil mass, without evidence of coil prolapse into the anterior cerebral arteries or anterior communicating artery. No filling defects are seen. Right internal carotid artery, head (immediate post-embolization): Injection reveals the presence of a widely patent ICA that leads to a patent ACA and MCA. Coil mass within the aneurysm is stable, without coil prolapse or filling defect to suggest thrombus. There does not appear to be any significant aneurysm filling seen. Right internal carotid artery, head (final control): Injection reveals the presence of a widely patent ICA that leads to a patent ACA and MCA. No thrombus is visualized. Coil mass is seen within the aneurysm without any aneurysm filling seen. Anterior communicating artery remains patent, with filling of the contralateral anterior circulation seen. The parenchymal and venous phases are unremarkable. DISPOSITION: After completion of the procedure, the femoral sheath was replaced with a 7 [place] ExoSeal closure device which was deployed successfully. The procedure was well tolerated and no early complications were observed. The patient was transferred to the neuro intensive care unit for further care. IMPRESSION: 1. 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 No aneurysm filling is seen post-embolization. 2. No other aneurysms, AVM, or high-flow fistulas are seen. Electronically Signed   By: Lisbeth Renshaw   On:  01/18/2017 17:10   Ir Angio Vertebral Sel Vertebral Uni L Mod Sed  Result Date: 01/02/2017 CLINICAL DATA:  The patient is a 66 year old woman presenting to the hospital with loss of consciousness after sudden onset of severe headache. CT scan demonstrated diffuse subarachnoid hemorrhage. She therefore presents for further workup with diagnostic cerebral angiogram and possible aneurysm coiling. OPERATOR: Dr. Lisbeth Renshaw EXAM: DIAGNOSTIC CEREBRAL ANGIOGRAM COIL EMBOLIZATION OF ANTERIOR COMMUNICATING ARTERY ANEURYSM PROCEDURE: The technical aspects of the procedure as well as its potential risks and benefits were reviewed with the patient. These risks included but were not limited to bleeding, infection, allergic reaction, damage to organs/vital structures, stroke, inability to repair vessel/place stent, delayed aneurysm rupture, and catastrophic outcomes of heart attack, coma, and death. With the understanding of these risks, informed consent was obtained and witnessed. The patient was placed in the supine position on the  angiography table and the skin of the right groin was prepped in the usual sterile fashion. The procedure was performed under general anesthesia provided by the anesthesiology department. An 8 French sheath was introduced in the right femoral artery using Seldinger technique. Heparin: 0 units Fluoroscopy Time:  See IR records Contrast:  See IR records CATHETERS AND WIRES: 5-French JB-1 glidecatheter 180 cm 0.035" glidewire 6-French NeuronMax guide sheath 6-French Berenstein Select catheter 0.058" CatV guidecatheter 150 cm Marksman microcatheter Synchro 2 standard microwire Excelsior XT-17 straight microcatheter COILS USED (MRI compatible): Target 360 3x6 Nano Target 360 2.5 x 4 Nano x4 VESSELS CATHETERIZED: Right internal carotid Left internal carotid Left vertebral artery Right anterior cerebral artery, A2 segment VESSELS STUDIED: Right internal carotid, head Left internal carotid, head  Left vertebral artery, head Right internal carotid artery (pre embolization ) Right internal carotid artery (during embolization ) Right internal carotid artery (immediate post embolization ) Right internal carotid artery final control PROCEDURAL DESCRIPTION: The JB 1 catheter was introduced over the Glidewire, and the right internal carotid, left internal carotid, and left vertebral artery were sequentially catheterized with cerebral angiograms taken. After review of the images, the catheter was removed without incident. Under real-time fluoroscopy, the select catheter within the NeuronMax sheath was introduced and advanced into the right internal carotid artery over an 0.035" guidewire. The Shuttle sheath was then advanced into the proximal right internal carotid artery. The Select catheter was removed, and repeat roadmap was obtained. A Marksman microcatheter was then introduced and under roadmap guidance was advanced over a Synchro 2 standard microwire into the distal M1 segment on the right. The CatV catheter was then tracked over the Marksman to its final position in the horizontal cavernous internal carotid. The Marksman was then removed and the coiling catheter introduced over the microwire. The catheter was then navigated into the aneurysm lumen over the microwire. The wire was then removed. The above coils were then sequentially deployed with progressive exclusion of the aneurysm. Angiograms were intermittently taken after coil deployment. Cerebral angiography was performed immediately post embolization. The coiling catheter was then removed and the CatV withdrawn into the cervical internal carotid artery. Final control angiography was then performed from the Navien guide catheter. The guide catheter and sheath were then synchronously removed without incident. The 8-Fr sheath was maintained on continuous flush and sutured in place. INTERPRETATION: Right femoral artery: The right femoral artery is  unremarkable, arterial sheath in adequate position. Right internal carotid, head Injection reveals a widely patent right internal carotid artery, which has normal bifurcation into anterior and middle cerebral arteries. The distal branches of the ACA and MCA are unremarkable. The anterior communicating artery complex is identified, with a anteriorly projecting aneurysm seen. This aneurysm measures approximately 4.0 x 3.0 mm. It has relatively narrow neck arising from the anterior communicating artery itself. Capillary phase does not demonstrate any perfusion deficits. Venous phase is unremarkable. Left internal carotid Injection reveals a widely patent left internal carotid artery with normal bifurcation into anterior and middle cerebral arteries. 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 Distal branches of the ACA and MCA are unremarkable. No other aneurysms, AVM, or fistulas are seen. Capillary phase does not demonstrate any perfusion deficits. Venous phase is unremarkable. Left vertebral Left vertebral artery is widely patent, as is the basilar artery. The basilar bifurcation is normal. Posterior cerebral arteries are unremarkable. There is contrast reflux down the contralateral vertebral artery. No aneurysms are  identified. There are no perfusion deficits. Venous phase is unremarkable. Right internal carotid artery, head (pre-embolization): Injection reveals the presence of a widely patent ICA that leads to a patent ACA and MCA. 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 The visualized portions of capillary and venous phases are unremarkable. Right internal carotid artery, head (during embolization): Angiograms taken during coil embolization demonstrate good position of the coil mass, without evidence of coil prolapse into the anterior cerebral arteries or anterior communicating artery. No filling defects are  seen. Right internal carotid artery, head (immediate post-embolization): Injection reveals the presence of a widely patent ICA that leads to a patent ACA and MCA. Coil mass within the aneurysm is stable, without coil prolapse or filling defect to suggest thrombus. There does not appear to be any significant aneurysm filling seen. Right internal carotid artery, head (final control): Injection reveals the presence of a widely patent ICA that leads to a patent ACA and MCA. No thrombus is visualized. Coil mass is seen within the aneurysm without any aneurysm filling seen. Anterior communicating artery remains patent, with filling of the contralateral anterior circulation seen. The parenchymal and venous phases are unremarkable. DISPOSITION: After completion of the procedure, the femoral sheath was replaced with a 7 [place] ExoSeal closure device which was deployed successfully. The procedure was well tolerated and no early complications were observed. The patient was transferred to the neuro intensive care unit for further care. IMPRESSION: 1. 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 No aneurysm filling is seen post-embolization. 2. No other aneurysms, AVM, or high-flow fistulas are seen. Electronically Signed   By: Lisbeth Renshaw   On: Jan 18, 2017 17:10   Ir Neuro Each Add'l After Basic Uni Right (ms)  Result Date: Jan 18, 2017 CLINICAL DATA:  The patient is a 66 year old woman presenting to the hospital with loss of consciousness after sudden onset of severe headache. CT scan demonstrated diffuse subarachnoid hemorrhage. She therefore presents for further workup with diagnostic cerebral angiogram and possible aneurysm coiling. OPERATOR: Dr. Lisbeth Renshaw EXAM: DIAGNOSTIC CEREBRAL ANGIOGRAM COIL EMBOLIZATION OF ANTERIOR COMMUNICATING ARTERY ANEURYSM PROCEDURE: The technical aspects of the  procedure as well as its potential risks and benefits were reviewed with the patient. These risks included but were not limited to bleeding, infection, allergic reaction, damage to organs/vital structures, stroke, inability to repair vessel/place stent, delayed aneurysm rupture, and catastrophic outcomes of heart attack, coma, and death. With the understanding of these risks, informed consent was obtained and witnessed. The patient was placed in the supine position on the angiography table and the skin of the right groin was prepped in the usual sterile fashion. The procedure was performed under general anesthesia provided by the anesthesiology department. An 8 French sheath was introduced in the right femoral artery using Seldinger technique. Heparin: 0 units Fluoroscopy Time:  See IR records Contrast:  See IR records CATHETERS AND WIRES: 5-French JB-1 glidecatheter 180 cm 0.035" glidewire 6-French NeuronMax guide sheath 6-French Berenstein Select catheter 0.058" CatV guidecatheter 150 cm Marksman microcatheter Synchro 2 standard microwire Excelsior XT-17 straight microcatheter COILS USED (MRI compatible): Target 360 3x6 Nano Target 360 2.5 x 4 Nano x4 VESSELS CATHETERIZED: Right internal carotid Left internal carotid Left vertebral artery Right anterior cerebral artery, A2 segment VESSELS STUDIED: Right internal carotid, head Left internal carotid, head Left vertebral artery, head Right internal carotid artery (pre embolization ) Right internal carotid artery (during embolization ) Right internal carotid artery (  immediate post embolization ) Right internal carotid artery final control PROCEDURAL DESCRIPTION: The JB 1 catheter was introduced over the Glidewire, and the right internal carotid, left internal carotid, and left vertebral artery were sequentially catheterized with cerebral angiograms taken. After review of the images, the catheter was removed without incident. Under real-time fluoroscopy, the select  catheter within the NeuronMax sheath was introduced and advanced into the right internal carotid artery over an 0.035" guidewire. The Shuttle sheath was then advanced into the proximal right internal carotid artery. The Select catheter was removed, and repeat roadmap was obtained. A Marksman microcatheter was then introduced and under roadmap guidance was advanced over a Synchro 2 standard microwire into the distal M1 segment on the right. The CatV catheter was then tracked over the Marksman to its final position in the horizontal cavernous internal carotid. The Marksman was then removed and the coiling catheter introduced over the microwire. The catheter was then navigated into the aneurysm lumen over the microwire. The wire was then removed. The above coils were then sequentially deployed with progressive exclusion of the aneurysm. Angiograms were intermittently taken after coil deployment. Cerebral angiography was performed immediately post embolization. The coiling catheter was then removed and the CatV withdrawn into the cervical internal carotid artery. Final control angiography was then performed from the Navien guide catheter. The guide catheter and sheath were then synchronously removed without incident. The 8-Fr sheath was maintained on continuous flush and sutured in place. INTERPRETATION: Right femoral artery: The right femoral artery is unremarkable, arterial sheath in adequate position. Right internal carotid, head Injection reveals a widely patent right internal carotid artery, which has normal bifurcation into anterior and middle cerebral arteries. The distal branches of the ACA and MCA are unremarkable. The anterior communicating artery complex is identified, with a anteriorly projecting aneurysm seen. This aneurysm measures approximately 4.0 x 3.0 mm. It has relatively narrow neck arising from the anterior communicating artery itself. Capillary phase does not demonstrate any perfusion deficits.  Venous phase is unremarkable. Left internal carotid Injection reveals a widely patent left internal carotid artery with normal bifurcation into anterior and middle cerebral arteries. 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 Distal branches of the ACA and MCA are unremarkable. No other aneurysms, AVM, or fistulas are seen. Capillary phase does not demonstrate any perfusion deficits. Venous phase is unremarkable. Left vertebral Left vertebral artery is widely patent, as is the basilar artery. The basilar bifurcation is normal. Posterior cerebral arteries are unremarkable. There is contrast reflux down the contralateral vertebral artery. No aneurysms are identified. There are no perfusion deficits. Venous phase is unremarkable. Right internal carotid artery, head (pre-embolization): Injection reveals the presence of a widely patent ICA that leads to a patent ACA and MCA. 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 The visualized portions of capillary and venous phases are unremarkable. Right internal carotid artery, head (during embolization): Angiograms taken during coil embolization demonstrate good position of the coil mass, without evidence of coil prolapse into the anterior cerebral arteries or anterior communicating artery. No filling defects are seen. Right internal carotid artery, head (immediate post-embolization): Injection reveals the presence of a widely patent ICA that leads to a patent ACA and MCA. Coil mass within the aneurysm is stable, without coil prolapse or filling defect to suggest thrombus. There does not appear to be any significant aneurysm filling seen. Right internal carotid artery, head (final control): Injection reveals the presence of a  widely patent ICA that leads to a patent ACA and MCA. No thrombus is visualized. Coil mass is seen within the aneurysm without any aneurysm filling seen.  Anterior communicating artery remains patent, with filling of the contralateral anterior circulation seen. The parenchymal and venous phases are unremarkable. DISPOSITION: After completion of the procedure, the femoral sheath was replaced with a 7 [place] ExoSeal closure device which was deployed successfully. The procedure was well tolerated and no early complications were observed. The patient was transferred to the neuro intensive care unit for further care. IMPRESSION: 1. 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 No aneurysm filling is seen post-embolization. 2. No other aneurysms, AVM, or high-flow fistulas are seen. Electronically Signed   By: Lisbeth Renshaw   On: 01/27/17 17:10    Microbiology Recent Results (from the past 240 hour(s))  Culture, blood (routine x 2)     Status: None   Collection Time: 01/08/17  6:20 PM  Result Value Ref Range Status   Specimen Description BLOOD LEFT ARM  Final   Special Requests IN PEDIATRIC BOTTLE Blood Culture adequate volume  Final   Culture NO GROWTH 5 DAYS  Final   Report Status 01/11/2017 FINAL  Final  Culture, blood (routine x 2)     Status: None   Collection Time: 01/08/17  6:30 PM  Result Value Ref Range Status   Specimen Description BLOOD LEFT HAND  Final   Special Requests   Final    BOTTLES DRAWN AEROBIC AND ANAEROBIC Blood Culture adequate volume   Culture NO GROWTH 5 DAYS  Final   Report Status 01/15/2017 FINAL  Final  Culture, respiratory (NON-Expectorated)     Status: None   Collection Time: 01/08/17  6:40 PM  Result Value Ref Range Status   Specimen Description TRACHEAL ASPIRATE  Final   Special Requests NONE  Final   Gram Stain   Final    FEW WBC PRESENT, PREDOMINANTLY PMN NO SQUAMOUS EPITHELIAL CELLS SEEN RARE YEAST    Culture Consistent with normal respiratory flora.  Final   Report Status 01/11/2017 FINAL   Final  Culture, Urine     Status: None   Collection Time: 01/09/17  6:30 AM  Result Value Ref Range Status   Specimen Description URINE, RANDOM  Final   Special Requests NONE  Final   Culture NO GROWTH  Final   Report Status 01/10/2017 FINAL  Final  C difficile quick scan w PCR reflex     Status: None   Collection Time: 01/11/17 12:00 PM  Result Value Ref Range Status   C Diff antigen NEGATIVE NEGATIVE Final   C Diff toxin NEGATIVE NEGATIVE Final   C Diff interpretation No C. difficile detected.  Final    Lab Basic Metabolic Panel:  Recent Labs Lab 01/10/17 0500 01/11/17 0428 01/12/17 0500 01/24/2017 0614  NA 161* 161* 155* 151*  150*  K 2.6* 3.3* 2.9* 3.1*  3.1*  CL >130* >130* >130* 129*  128*  CO2 16* 15* 15* 17*  18*  GLUCOSE 194* 176* 125* 121*  120*  BUN 57* 50* 34* 20  20  CREATININE 1.28* 1.18* 1.03* 0.93  0.94  CALCIUM 8.4* 8.5* 8.3* 8.3*  8.3*  MG 2.3 2.3 2.2 2.1  PHOS 4.5 4.4 3.6 3.3   Liver Function Tests:  Recent Labs Lab 01/10/17 0500 01/11/17 0428 01/12/17 0500 01/17/2017 0614  ALBUMIN 2.1* 2.2* 2.0* 2.0*   No  results for input(s): LIPASE, AMYLASE in the last 168 hours. No results for input(s): AMMONIA in the last 168 hours. CBC:  Recent Labs Lab 01/10/17 0500 01/11/17 0428 01/12/17 0500 16-Jan-2017 0614  WBC 8.7 11.4* 7.9 11.5*  NEUTROABS 6.3 8.3* 6.1 8.6*  HGB 10.0* 10.4* 10.4* 10.8*  HCT 31.5* 32.8* 33.1* 34.0*  MCV 89.0 88.9 89.2 87.4  PLT 242 267 243 304   Cardiac Enzymes: No results for input(s): CKTOTAL, CKMB, CKMBINDEX, TROPONINI in the last 168 hours. Sepsis Labs:  Recent Labs Lab 01/10/17 0500 01/11/17 0428 01/12/17 0500 Jan 16, 2017 0614  WBC 8.7 11.4* 7.9 11.5*    Procedures/Operations  Diagnostic angiogram, coiling of anterior communicating artery aneurysm   Konstantinos Cordoba, C 01/16/2017, 10:27 AM

## 2017-01-27 NOTE — Progress Notes (Signed)
PULMONARY / CRITICAL CARE MEDICINE   Name: Sherri Hale MRN: [id number] DOB: [birth year]    ADMISSION DATE:  12/30/2016 CONSULTATION DATE:  01/16/2017  REFERRING MD:  Verdie MosherLiu  CHIEF COMPLAINT:  Sub arachnoid hemorrhage  BRIEF:   66 y/o female admitted on 6/6 with a subarachnoid hemorrhage who has made no neurologic progress since admission.   SUBJECTIVE:  No acute issues  VITAL SIGNS: BP 110/65   Pulse (!) 59   Temp 98.5 F (36.9 C) (Axillary)   Resp (!) 0   Ht 5\' 3"  (1.6 m)   Wt 206 lb 12.7 oz (93.8 kg)   LMP  (LMP Unknown)   SpO2 99%   BMI 36.63 kg/m   HEMODYNAMICS:    VENTILATOR SETTINGS: Vent Mode: PRVC FiO2 (%):  [30 %] 30 % Set Rate:  [20 bmp] 20 bmp Vt Set:  [490 mL] 490 mL PEEP:  [5 cmH20] 5 cmH20 Plateau Pressure:  [24 cmH20-26 cmH20] 24 cmH20  INTAKE / OUTPUT: I/O last 3 completed shifts: In: 5753.5 [I.V.:3653.5; NG/GT:2100] Out: 4600 [Urine:4025; Emesis/NG output:250; Stool:325]  PHYSICAL EXAMINATION: General:  Lying in bed HENT: NCAT facial swelling noted, ETT in place PULM: CTA B, vent supported breathing CV: RRR, no mgr GI: BS+, soft, nontender MSK: normal bulk and tone Neuro: some spontaneous movement feet, no purposeful movement   LABS:  BMET  Recent Labs Lab 01/11/17 0428 01/12/17 0500 01/08/2017 0614  NA 161* 155* 151*  150*  K 3.3* 2.9* 3.1*  3.1*  CL >130* >130* 129*  128*  CO2 15* 15* 17*  18*  BUN 50* 34* 20  20  CREATININE 1.18* 1.03* 0.93  0.94  GLUCOSE 176* 125* 121*  120*    Electrolytes  Recent Labs Lab 01/11/17 0428 01/12/17 0500 01/21/2017 0614  CALCIUM 8.5* 8.3* 8.3*  8.3*  MG 2.3 2.2 2.1  PHOS 4.4 3.6 3.3    CBC  Recent Labs Lab 01/11/17 0428 01/12/17 0500 01/24/2017 0614  WBC 11.4* 7.9 11.5*  HGB 10.4* 10.4* 10.8*  HCT 32.8* 33.1* 34.0*  PLT 267 243 304    Coag's No results for input(s): APTT, INR in the last 168 hours.  Sepsis Markers No results for input(s): LATICACIDVEN,  PROCALCITON, O2SATVEN in the last 168 hours.  ABG  Recent Labs Lab 01/07/17 1050 01/08/17 0320  PHART 7.267* 7.334*  PCO2ART 25.1* 23.0*  PO2ART 100 106    Liver Enzymes  Recent Labs Lab 01/11/17 0428 01/12/17 0500 01/15/2017 0614  ALBUMIN 2.2* 2.0* 2.0*    Cardiac Enzymes No results for input(s): TROPONINI, PROBNP in the last 168 hours.  Glucose  Recent Labs Lab 01/12/17 1139 01/12/17 1618 01/12/17 2050 01/12/17 2311 01/24/2017 0406 01/09/2017 0730  GLUCAP 141* 137* 109* 116* 131* 106*    Imaging No results found.  IMAGING/STUDIES: UDS 6/6:  Negative  CT HEAD W/O 6/6: IMPRESSION: 1. Massive subarachnoid hemorrhage over both convexities and within the sylvian fissures, basal cisterns and the lateral and third ventricles. Given the distribution, an aneurysmal etiology is favored, though other entities may also cause this distribution. However, it is somewhat unclear whether there may be a component of intraparenchymal hemorrhage of the inferomedial frontal lobe. CTA of the head is recommended. 2. Effacement of the basal cisterns and crowding of the foramen magnum with early/impending herniation of the cerebellar tonsils. PORT CXR 6/12:  Previously reviewed by me. Endotracheal tube in good position. Enteric feeding tube coursing below diaphragm. Persistent lower lobe and perihilar patchy opacification with some  slight worsening of silhouetting of bilateral hemidiaphragms. PORT ABD X-RAY 2 VIEW 6/14:  Findings compatible with a small bowel ileus or mid to distal small bowel obstruction. The nasogastric tube is in reasonable position but could be advanced an additional 5-10 cm to assure that the proximal port remains below the GE junction.  MICROBIOLOGY: MRSA PCR 6/6:  Negative  Tracheal Aspirate Culture 6/9: MSSA & Klebsiella (sensitive to Unasyn) Blood Cultures x2 6/12 >>> Tracheal Aspirate Culture 6/12 >>> Rare yeast Urine Culture 6/12:  Negative  Stool C diff 6/15  >>>  ANTIBIOTICS: Unasyn 6/10 - 6/13 Cipro 6/13 >>>  SIGNIFICANT EVENTS: 06/06 - Admit 06/07 - ACOM aneurysm coiling 06/09 - Antibiotics empirically started for increased secretions 06/12 - Febrile >> recultured 06/13 - Unasyn switched to Cipro given sensitivities & worsening hypernatremia. FWD 5.4 L>>started Free Water 200cc VT q4hr  LINES/TUBES: OETT 6/6 >>> RUE PICC 6/9 >>> Foley 6/6 >>> OGT 6/6 >>> L Rad Art Line 6/6 >>> PIV  DISCUSSION: 66 y/o female with a devastating subarachnoid hemorrhage.  ASSESSMENT / PLAN:  PULMONARY A: Acute respiratory failure due to inability to protect airway P:   Continue full vent support for now Plan extubation today after family arrives  CARDIOVASCULAR A:  SAH P:  BP goals per neurology  RENAL A:   Iatrogenic hypernatremia P:   Monitor BMET and UOP Replace electrolytes as needed   GASTROINTESTINAL A:   Ileus P:   Continue OG tube routine care  HEMATOLOGIC A:   No acute issues P:  Monitor for bleeding  INFECTIOUS A:   No acute issues P:   Monitor for fever  ENDOCRINE A:   Hyperglycemia> well controlled P:   Continue glargine Continue SSI  NEUROLOGIC A:   Subarachnoid hemorrhage Acute encephalopathy P:   RASS goal: 0 Continue precedex for comfort/vent synchrony   FAMILY  - Updates: plan one way extubation today after family arrives  - Inter-disciplinary family meet or Palliative Care: note from 6/15 reviewed, appreciate their support  My cc time 30 minutes  Heber Clermont, MD [place] [facility] Pager: [phone] Cell: [phone] After 3pm or if no response, call [phone]   02-03-17, 7:59 AM

## 2017-01-27 NOTE — Procedures (Signed)
Extubation Procedure Note  Patient Details:   Name: Sherri Hale DOB: 1950/10/20 MRN: [id number]   Airway Documentation:     Evaluation  O2 sats: transiently fell during during procedure Complications: No apparent complications Patient did tolerate procedure well. Bilateral Breath Sounds: Rhonchi   No   Terminal extubation. Family brought back to room post extubation.  Forest BeckerJean S Mariella Blackwelder 01/19/2017, 1:52 PM

## 2017-01-27 DEATH — deceased

## 2018-12-20 IMAGING — XA IR NEURO EACH ADD'L  AFTER BASIC UNI RIGHT (MS)
11 of 17 series · 11 of 24 positions shown · IV contrast (IODINE)
Comparison: none

CLINICAL DATA: The patient is a 65-year-old woman presenting to the
hospital with loss of consciousness after sudden onset of severe
headache. CT scan demonstrated diffuse subarachnoid hemorrhage. She
therefore presents for further workup with diagnostic cerebral
angiogram and possible aneurysm coiling..

[Series 2: carotid care 2 · 2 acquisitions, 1 frame shown (1 of 9)]
[im 1/2]
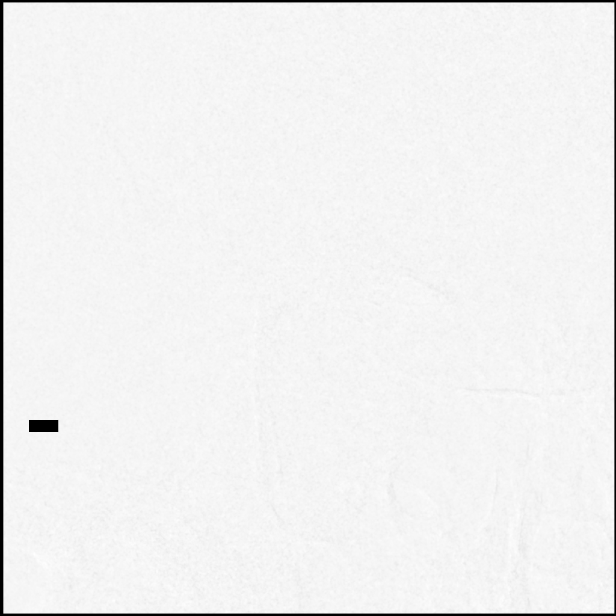

[Series 3: carotid care 2 · 1 of 7 frames shown (2 of 9)]
[frame 4/7]
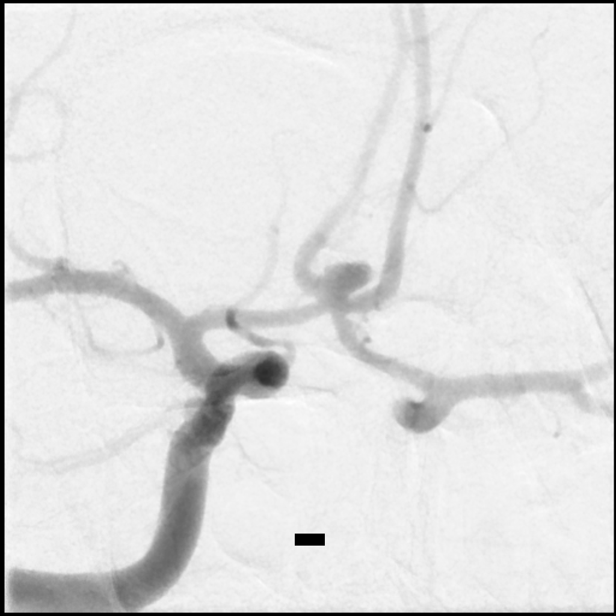

[Series 5: carotid care 2 · 2 acquisitions, 1 frame shown (3 of 9)]
[im 1/2]
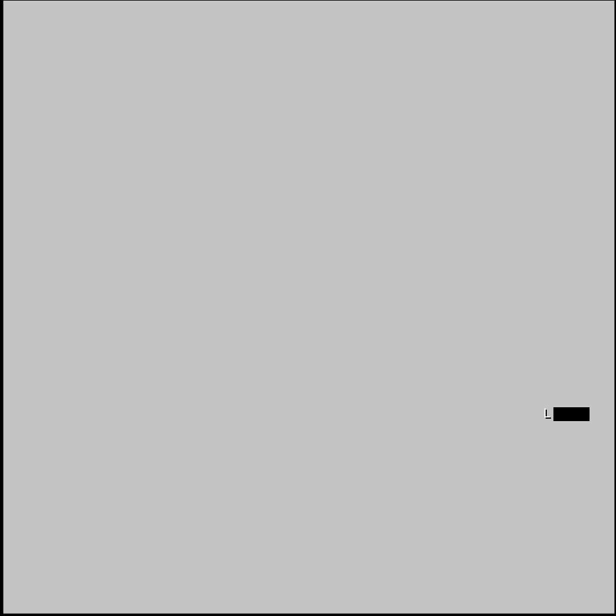

[Series 6: carotid care 2 · 2 acquisitions, 1 frame shown (4 of 9)]
[im 1/2]
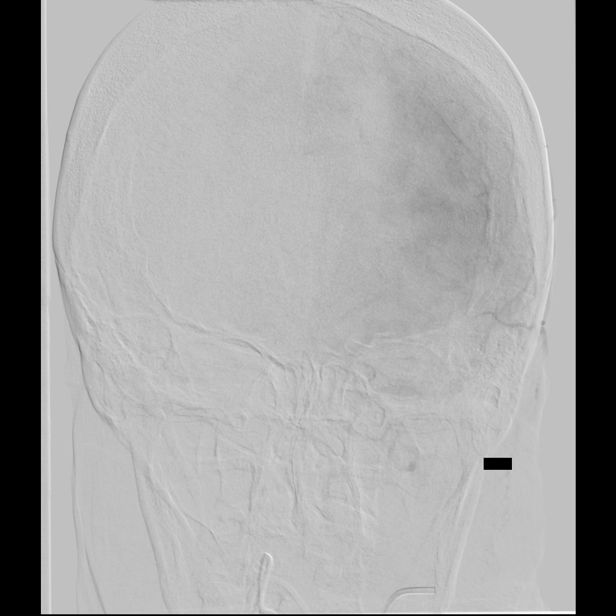

[Series 8: carotid care 2 · 2 acquisitions, 1 frame shown (5 of 9)]
[im 1/2]
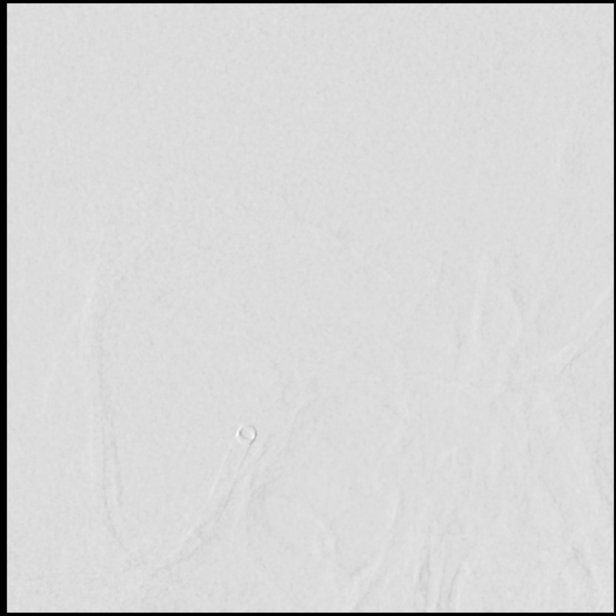

[Series 10: carotid care 2 · 2 acquisitions, 1 frame shown (6 of 9)]
[im 1/2]
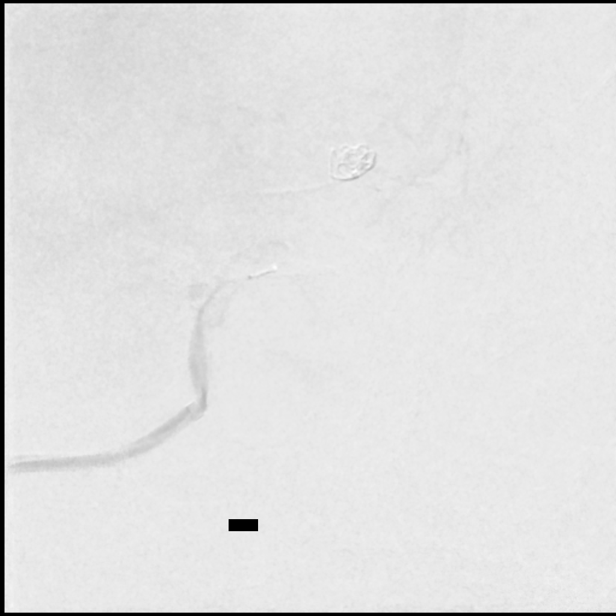

[Series 13: carotid care 2 · 2 acquisitions, 1 frame shown (7 of 9)]
[im 1/2]
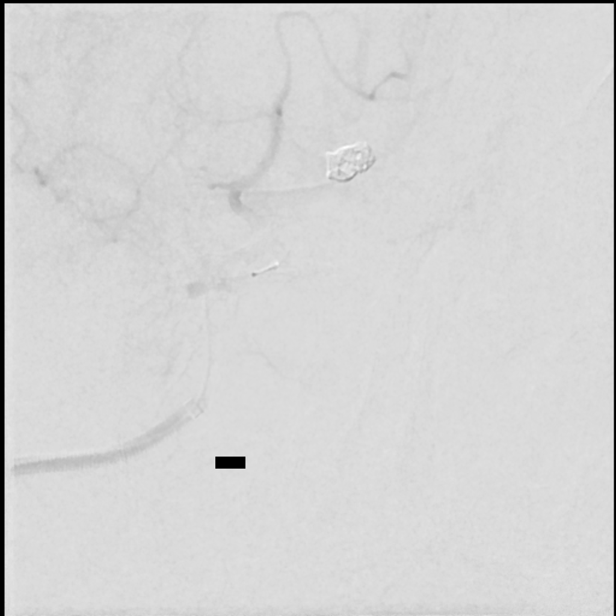

[Series 15: carotid care 2 · 2 acquisitions, 1 frame shown (8 of 9)]
[im 1/2]
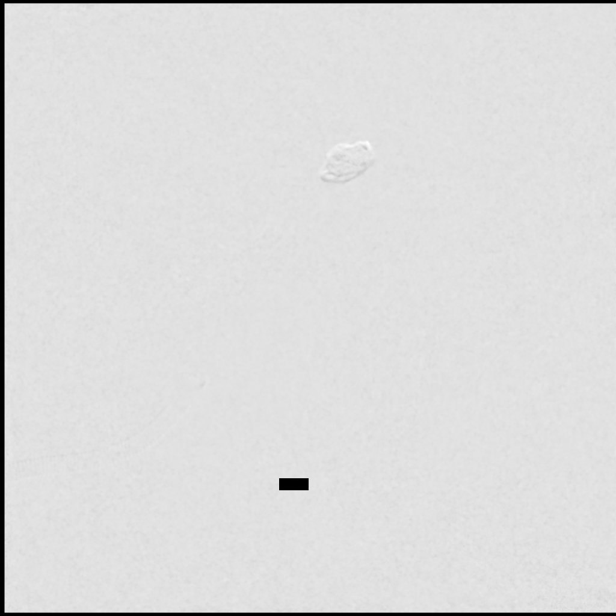

[Series 16: carotid care 2 · 2 acquisitions, 1 frame shown (9 of 9)]
[im 1/2]
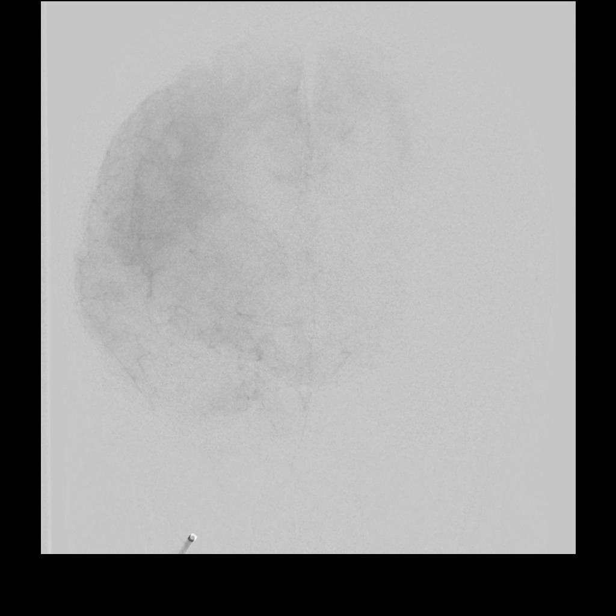

[Series 17: fl neuro n · 1 of 23 frames shown]
[frame 20/23]
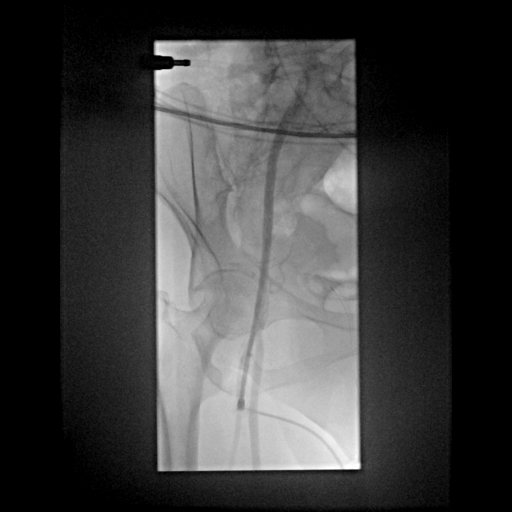

[Series 300: dr. (person_name) · 1 of 29 slices shown]
[im 17/29]
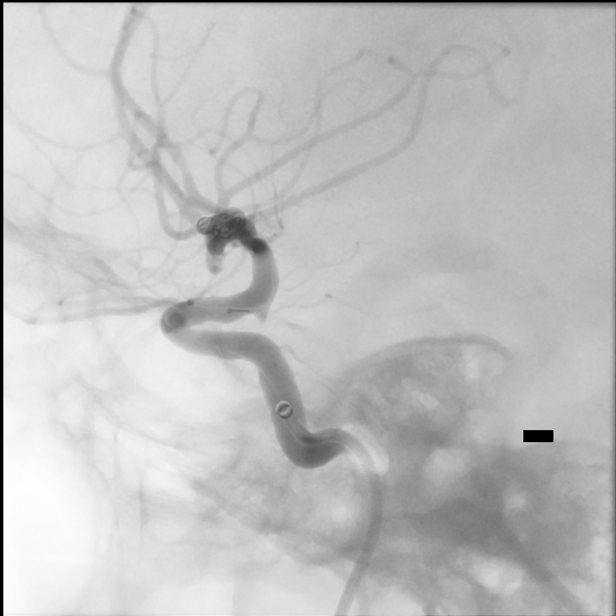

[11 of 24 positions shown; findings below may reference images not displayed]

OPERATOR:

Peturus. Borgella Bizama

EXAM:
DIAGNOSTIC CEREBRAL ANGIOGRAM

COIL EMBOLIZATION OF ANTERIOR COMMUNICATING ARTERY ANEURYSM

PROCEDURE:
The technical aspects of the procedure as well as its potential
risks and benefits were reviewed with the patient. These risks
included but were not limited to bleeding, infection, allergic
reaction, damage to organs/vital structures, stroke, inability to
repair vessel/place stent, delayed aneurysm rupture, and
catastrophic outcomes of heart attack, coma, and death. With the
understanding of these risks, informed consent was obtained and
witnessed.

The patient was placed in the supine position on the angiography
table and the skin of the right groin was prepped in the usual
sterile fashion. The procedure was performed under general
anesthesia provided by the anesthesiology department.

An 8 French sheath was introduced in the right femoral artery using
Seldinger technique.

Heparin: 0 units

Fluoroscopy Time:  See IR records

Contrast:  See IR records

CATHETERS AND WIRES:

5-French JB-1 glidecatheter

180 cm 0.035" glidewire

6-French NeuronMax guide sheath

6-Memin Bilodeau Select catheter

0.058" CatV guidecatheter

150 cm Marksman microcatheter

Synchro 2 standard microwire

Excelsior 35-9B straight microcatheter

COILS USED (MRI compatible):

Target 360 3x6 Nano

Target 360 2.5 x 4 Nano x4

VESSELS CATHETERIZED:

Right internal carotid

Left internal carotid

Left vertebral artery

Right anterior cerebral artery, A2 segment

VESSELS STUDIED:

Right internal carotid, head

Left internal carotid, head

Left vertebral artery, head

Right internal carotid artery (pre embolization )

Right internal carotid artery (during embolization )

Right internal carotid artery (immediate post embolization )

Right internal carotid artery final control

PROCEDURAL DESCRIPTION:

The JB 1 catheter was introduced over the Glidewire, and the right
internal carotid, left internal carotid, and left vertebral artery
were sequentially catheterized with cerebral angiograms taken. After
review of the images, the catheter was removed without incident.

Under real-time fluoroscopy, the select catheter within the
NeuronMax sheath was introduced and advanced into the right internal
carotid artery over an 0.035" guidewire. The Shuttle sheath was then
advanced into the proximal right internal carotid artery. The Select
catheter was removed, and repeat roadmap was obtained.

A Marksman microcatheter was then introduced and under roadmap
guidance was advanced over a Synchro 2 standard microwire into the
distal M1 segment on the right. The CatV catheter was then tracked
over the Marksman to its final position in the horizontal cavernous
internal carotid. The Marksman was then removed and the coiling
catheter introduced over the microwire. The catheter was then
navigated into the aneurysm lumen over the microwire. The wire was
then removed. The above coils were then sequentially deployed with
progressive exclusion of the aneurysm. Angiograms were
intermittently taken after coil deployment.

Cerebral angiography was performed immediately post embolization.
The coiling catheter was then removed and the CatV withdrawn into
the cervical internal carotid artery. Final control angiography was
then performed from the Navien guide catheter.

The guide catheter and sheath were then synchronously removed
without incident. The 8-Fr sheath was maintained on continuous flush
and sutured in place.

INTERPRETATION:

Right femoral artery:

The right femoral artery is unremarkable, arterial sheath in
adequate position.

Right internal carotid, head

Injection reveals a widely patent right internal carotid artery,
which has normal bifurcation into anterior and middle cerebral
arteries. The distal branches of the ACA and MCA are unremarkable.
The anterior communicating artery complex is identified, with a
anteriorly projecting aneurysm seen. This aneurysm measures
approximately 4.0 x 3.0 mm. It has relatively narrow neck arising
from the anterior communicating artery itself. Capillary phase does
not demonstrate any perfusion deficits. Venous phase is
unremarkable.

Left internal carotid

Injection reveals a widely patent left internal carotid artery with
normal bifurcation into anterior and middle cerebral arteries. The
anterior communicating artery aneurysm is again visualized as
described in the right internal carotid artery injection. Distal
branches of the ACA and MCA are unremarkable. No other aneurysms,
AVM, or fistulas are seen. Capillary phase does not demonstrate any
perfusion deficits. Venous phase is unremarkable.

Left vertebral

Left vertebral artery is widely patent, as is the basilar artery.
The basilar bifurcation is normal. Posterior cerebral arteries are
unremarkable. There is contrast reflux down the contralateral
vertebral artery. No aneurysms are identified. There are no
perfusion deficits. Venous phase is unremarkable.

Right internal carotid artery, head (pre-embolization):

Injection reveals the presence of a widely patent ICA that leads to
a patent ACA and MCA. The anterior communicating artery aneurysm is
again visualized. The visualized portions of capillary and venous
phases are unremarkable.

Right internal carotid artery, head (during embolization):

Angiograms taken during coil embolization demonstrate good position
of the coil mass, without evidence of coil prolapse into the
anterior cerebral arteries or anterior communicating artery. No
filling defects are seen.

Right internal carotid artery, head (immediate post-embolization):

Injection reveals the presence of a widely patent ICA that leads to
a patent ACA and MCA. Coil mass within the aneurysm is stable,
without coil prolapse or filling defect to suggest thrombus. There
does not appear to be any significant aneurysm filling seen.

Right internal carotid artery, head (final control):

Injection reveals the presence of a widely patent ICA that leads to
a patent ACA and MCA. No thrombus is visualized. Coil mass is seen
within the aneurysm without any aneurysm filling seen. Anterior
communicating artery remains patent, with filling of the
contralateral anterior circulation seen. The parenchymal and venous
phases are unremarkable.

DISPOSITION:

After completion of the procedure, the femoral sheath was replaced
with a 7 French ExoSeal closure device which was deployed
successfully. The procedure was well tolerated and no early
complications were observed.

The patient was transferred to the neuro intensive care unit for
further care.
IMPRESSION: 1. Anterior communicating artery aneurysm is the source of
subarachnoid hemorrhage, successfully coiled without local
thrombotic or distal embolic complication. No aneurysm filling is
seen post-embolization.

2. No other aneurysms, AVM, or high-flow fistulas are seen.

## 2018-12-22 IMAGING — DX DG CHEST 1V PORT
1 series · 1 of 1 positions shown · non-contrast
Comparison: January 03, 2017

CLINICAL DATA: Hypoxia

EXAM:
PORTABLE CHEST 1 VIEW

[chest]
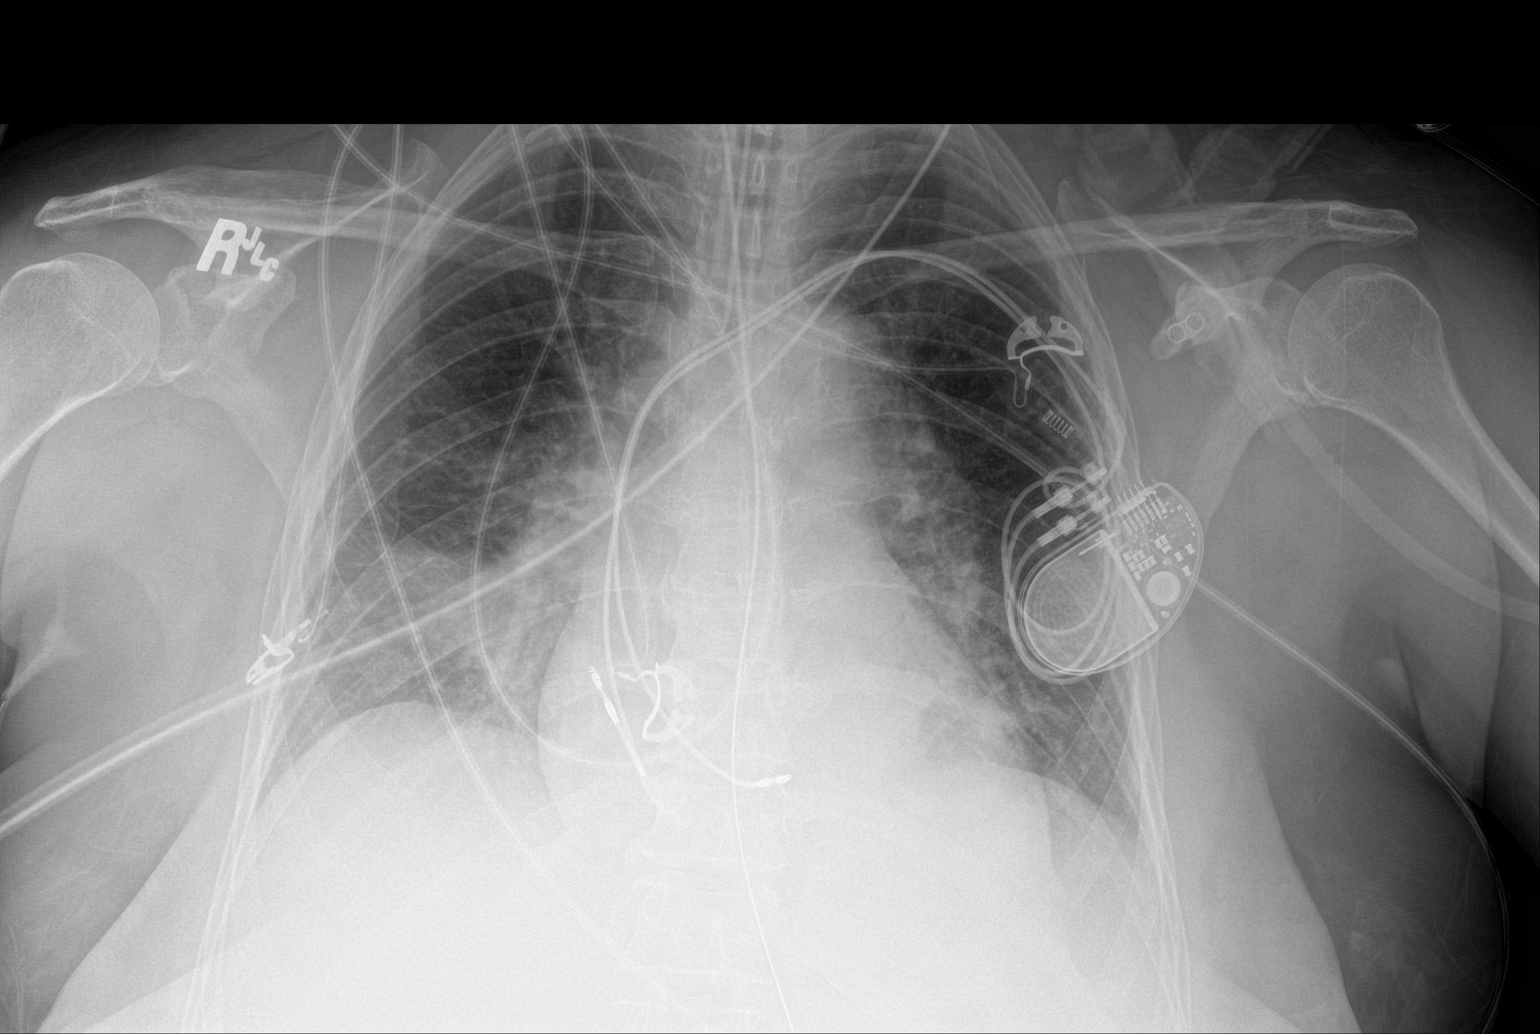

[1 of 1 positions shown; findings below may reference images not displayed]

FINDINGS: Endotracheal tube tip is 2.1 cm above the carina. Nasogastric tube
tip and side port are below the diaphragm. No pneumothorax.
Pacemaker leads are unchanged in position. There is slight
atelectatic change in the left base. Lungs elsewhere clear. Heart is
upper normal in size with pulmonary vascular within normal limits.
There is aortic atherosclerosis. No adenopathy. No bone lesions.
IMPRESSION: Tube positions as described without evident pneumothorax. Left base
atelectasis. Lungs elsewhere clear. Stable cardiac silhouette.
